# Patient Record
Sex: Female | Born: 2000 | Race: Black or African American | Hispanic: No | Marital: Single | Smoking: Never smoker
Health system: Southern US, Community
[De-identification: ages and names within clinical notes are randomized; demographics above are authoritative.]

---

## 2019-05-16 ENCOUNTER — Emergency Department (HOSPITAL_COMMUNITY): Payer: Federal, State, Local not specified - PPO

## 2019-05-16 ENCOUNTER — Encounter (HOSPITAL_COMMUNITY): Payer: Self-pay | Admitting: Emergency Medicine

## 2019-05-16 ENCOUNTER — Other Ambulatory Visit: Payer: Self-pay

## 2019-05-16 ENCOUNTER — Emergency Department (HOSPITAL_COMMUNITY)
Admission: EM | Admit: 2019-05-16 | Discharge: 2019-05-16 | Disposition: A | Discharge status: Home / Self Care | Payer: Federal, State, Local not specified - PPO | Attending: Emergency Medicine | Admitting: Emergency Medicine

## 2019-05-16 DIAGNOSIS — Y999 Unspecified external cause status: Secondary | ICD-10-CM | POA: Diagnosis not present

## 2019-05-16 DIAGNOSIS — Y9389 Activity, other specified: Secondary | ICD-10-CM | POA: Diagnosis not present

## 2019-05-16 DIAGNOSIS — R0789 Other chest pain: Secondary | ICD-10-CM | POA: Diagnosis not present

## 2019-05-16 DIAGNOSIS — Y92410 Unspecified street and highway as the place of occurrence of the external cause: Secondary | ICD-10-CM | POA: Diagnosis not present

## 2019-05-16 MED ORDER — IBUPROFEN 800 MG PO TABS: 800.0000 mg | ORAL_TABLET | Freq: Three times a day (TID) | ORAL | 0 refills | Status: DC | PRN
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
## 2019-05-16 NOTE — Discharge Instructions (Addendum)
Your x-rays did not show any abnormality.  You can expect to be sore over the next 7 to 10 days.  Use ice and heat on the areas that are most sore.  Return here for any worsening in your condition.
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
## 2019-08-22 ENCOUNTER — Ambulatory Visit: Payer: Federal, State, Local not specified - PPO | Admitting: Adult Health

## 2019-08-23 ENCOUNTER — Ambulatory Visit (INDEPENDENT_AMBULATORY_CARE_PROVIDER_SITE_OTHER): Payer: Federal, State, Local not specified - PPO | Admitting: Adult Health

## 2019-08-23 ENCOUNTER — Other Ambulatory Visit: Payer: Self-pay

## 2019-08-23 ENCOUNTER — Encounter: Payer: Self-pay | Admitting: Adult Health

## 2019-08-23 ENCOUNTER — Ambulatory Visit: Payer: Self-pay | Admitting: Adult Health

## 2019-08-23 VITALS — BP 128/74 | HR 89 | Ht 65.0 in | Wt 138.0 lb

## 2019-08-23 DIAGNOSIS — F411 Generalized anxiety disorder: Secondary | ICD-10-CM

## 2019-08-23 DIAGNOSIS — F331 Major depressive disorder, recurrent, moderate: Secondary | ICD-10-CM | POA: Diagnosis not present

## 2019-08-23 DIAGNOSIS — F410 Panic disorder [episodic paroxysmal anxiety] without agoraphobia: Secondary | ICD-10-CM

## 2019-08-23 DIAGNOSIS — G4700 Insomnia, unspecified: Secondary | ICD-10-CM | POA: Diagnosis not present

## 2019-08-23 MED ORDER — FLUOXETINE HCL 10 MG PO CAPS: ORAL_CAPSULE | ORAL | 2 refills | Status: DC
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
## 2019-09-15 ENCOUNTER — Other Ambulatory Visit: Payer: Self-pay | Admitting: Adult Health

## 2019-09-15 DIAGNOSIS — F411 Generalized anxiety disorder: Secondary | ICD-10-CM

## 2019-09-15 DIAGNOSIS — F410 Panic disorder [episodic paroxysmal anxiety] without agoraphobia: Secondary | ICD-10-CM

## 2019-09-15 DIAGNOSIS — F331 Major depressive disorder, recurrent, moderate: Secondary | ICD-10-CM

## 2019-09-17 NOTE — Telephone Encounter (Signed)
Medication started 08/23/2019 but has f/up on 03/25

## 2019-09-20 ENCOUNTER — Other Ambulatory Visit: Payer: Self-pay

## 2019-09-20 ENCOUNTER — Ambulatory Visit (INDEPENDENT_AMBULATORY_CARE_PROVIDER_SITE_OTHER): Payer: Federal, State, Local not specified - PPO | Admitting: Adult Health

## 2019-09-20 ENCOUNTER — Encounter: Payer: Self-pay | Admitting: Adult Health

## 2019-09-20 DIAGNOSIS — F331 Major depressive disorder, recurrent, moderate: Secondary | ICD-10-CM | POA: Diagnosis not present

## 2019-09-20 DIAGNOSIS — F410 Panic disorder [episodic paroxysmal anxiety] without agoraphobia: Secondary | ICD-10-CM | POA: Diagnosis not present

## 2019-09-20 DIAGNOSIS — F411 Generalized anxiety disorder: Secondary | ICD-10-CM

## 2019-09-20 DIAGNOSIS — G4700 Insomnia, unspecified: Secondary | ICD-10-CM | POA: Diagnosis not present

## 2019-09-20 MED ORDER — FLUOXETINE HCL 20 MG PO CAPS: ORAL_CAPSULE | ORAL | 2 refills | Status: DC

## 2019-09-20 MED ORDER — LORAZEPAM 0.5 MG PO TABS: 0.5000 mg | ORAL_TABLET | Freq: Every day | ORAL | 2 refills | Status: DC

## 2019-09-20 NOTE — Progress Notes (Signed)
Kim Walker [id number] 2001-05-04 18 y.o.  Subjective:   Patient ID:  Kim Walker is a 19 y.o. (DOB 18-Nov-2000) female.  Chief Complaint: No chief complaint on file.   HPI Kim Walker presents to the office today for follow-up of GAD, MDD, insomnia, and panic attacks.    Describes mood today as "improved". Mood symptoms - reports decreased depression, anxiety, and irritability. Stating "my emotions and feelings are better". Has not been "crying" as much.  as opened up to her family about how she feels. Stating my dad was "against" medication initially, but sees that it is helping me. Feels like she is "stressed out", but feels like work is responsible. Working "crazy hours". Getting 40 hours a week (2 jobs) and has 6 classes. Stable interest and motivation. Taking medications as prescribed.  Energy levels "better for the most part". Active, does not have a regular exercise routine.  Enjoys some usual interests and activities. Single. Lives with 2 roommates off campus. Not dating - recent break-up. Family lives in [place]. Appetite adequate. Weight stable. Sleeping difficulties. Averaging 4 to 5 hours with working 2 jobs and studying. Not able to cut mind off when she can sleep. Focus and concentration stable. Completing tasks. Managing aspects of household. 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 Denies SI or HI. Denies AH or VH.  Previous medication trials: Xanax  Review of Systems:  Review of Systems  Musculoskeletal: Negative for gait problem.  Neurological: Negative for tremors.  Psychiatric/Behavioral:       Please refer to HPI    Medications: I have reviewed the patient's current medications.  Current Outpatient Medications  Medication Sig Dispense Refill  . FLUoxetine (PROZAC) 20 MG capsule Take one capsule daily. 30 capsule 2  . ibuprofen (ADVIL) 800 MG tablet Take 1 tablet (800 mg total) by mouth every 8 (eight) hours as needed. 21  tablet 0  . LORazepam (ATIVAN) 0.5 MG tablet Take 1 tablet (0.5 mg total) by mouth at bedtime. 30 tablet 2  . Norethindrone Acetate-Ethinyl Estrad-FE (BLISOVI 24 FE) 1-20 MG-MCG(24) tablet Take by mouth.     No current facility-administered medications for this visit.    Medication Side Effects: None  Allergies: No Known Allergies  No past medical history on file.  No family history on file.  Social History   Socioeconomic History  . Marital status: Single    Spouse name: Not on file  . Number of children: Not on file  . Years of education: Not on file  . Highest education level: Not on file  Occupational History  . Not on file  Tobacco Use  . Smoking status: Never Smoker  . Smokeless tobacco: Never Used  Substance and Sexual Activity  . Alcohol use: Never  . Drug use: Never  . Sexual activity: Not on file  Other Topics Concern  . Not on file  Social History Narrative  . Not on file   Social Determinants of Health   Financial Resource Strain:   . Difficulty of Paying Living Expenses:   Food Insecurity:   . Worried About Programme researcher, broadcasting/film/video in the Last Year:   . Barista in the Last Year:   Transportation Needs:   . Freight forwarder (Medical):   Marland Kitchen Lack of Transportation (Non-Medical):   Physical Activity:   . Days of Exercise per Week:   . Minutes of Exercise per Session:   Stress:   . Feeling  of Stress :   Social Connections:   . Frequency of Communication with Friends and Family:   . Frequency of Social Gatherings with Friends and Family:   . Attends Religious Services:   . Active Member of Clubs or Organizations:   . Attends Archivist Meetings:   Marland Kitchen Marital Status:   Intimate Partner Violence:   . Fear of Current or Ex-Partner:   . Emotionally Abused:   Marland Kitchen Physically Abused:   . Sexually Abused:     Past Medical History, Surgical history, Social history, and Family history were reviewed and updated as appropriate.   Please  see review of systems for further details on the patient's review from today.   Objective:   Physical Exam:  There were no vitals taken for this visit.  Physical Exam Constitutional:      General: She is not in acute distress. Musculoskeletal:        General: No deformity.  Neurological:     Mental Status: She is alert and oriented to person, place, and time.     Coordination: Coordination normal.  Psychiatric:        Attention and Perception: Attention and perception normal. She does not perceive auditory or visual hallucinations.        Mood and Affect: Mood normal. Mood is not anxious or depressed. Affect is not labile, blunt, angry or inappropriate.        Speech: Speech normal.        Behavior: Behavior normal.        Thought Content: Thought content normal. Thought content is not paranoid or delusional. Thought content does not include homicidal or suicidal ideation. Thought content does not include homicidal or suicidal plan.        Cognition and Memory: Cognition and memory normal.        Judgment: Judgment normal.     Comments: Insight intact     Lab Review:  No results found for: NA, K, CL, CO2, GLUCOSE, BUN, CREATININE, CALCIUM, PROT, ALBUMIN, AST, ALT, ALKPHOS, BILITOT, GFRNONAA, GFRAA  No results found for: WBC, RBC, HGB, HCT, PLT, MCV, MCH, MCHC, RDW, LYMPHSABS, MONOABS, EOSABS, BASOSABS  No results found for: POCLITH, LITHIUM   No results found for: PHENYTOIN, PHENOBARB, VALPROATE, CBMZ   .res Assessment: Plan:   Plan:  PDMP reviewed  1. Prozac 20mg  daily 2. Add Lorazepam 0.5mg  at hs  Set up with therapist - sees Sammuel Cooper in April  RTC 4 weeks  Patient advised to contact office with any questions, adverse effects, or acute worsening in signs and symptoms.  Diagnoses and all orders for this visit:  Insomnia, unspecified type  Major depressive disorder, recurrent episode, moderate (HCC) -     FLUoxetine (PROZAC) 20 MG capsule; Take one capsule  daily.  Generalized anxiety disorder -     FLUoxetine (PROZAC) 20 MG capsule; Take one capsule daily. -     LORazepam (ATIVAN) 0.5 MG tablet; Take 1 tablet (0.5 mg total) by mouth at bedtime.  Panic attacks -     FLUoxetine (PROZAC) 20 MG capsule; Take one capsule daily.     Please see After Visit Summary for patient specific instructions.  Future Appointments  Date Time Provider Keys  10/02/2019  2:00 PM Barnie Del, LCSW [facility] None    No orders of the defined types were placed in this encounter.   -------------------------------

## 2019-09-25 ENCOUNTER — Emergency Department (HOSPITAL_COMMUNITY)
Admission: EM | Admit: 2019-09-25 | Discharge: 2019-09-25 | Disposition: A | Discharge status: Home / Self Care | Payer: Federal, State, Local not specified - PPO | Attending: Emergency Medicine | Admitting: Emergency Medicine

## 2019-09-25 ENCOUNTER — Other Ambulatory Visit: Payer: Self-pay

## 2019-09-25 ENCOUNTER — Emergency Department (HOSPITAL_COMMUNITY): Payer: Federal, State, Local not specified - PPO

## 2019-09-25 DIAGNOSIS — Z79899 Other long term (current) drug therapy: Secondary | ICD-10-CM | POA: Diagnosis not present

## 2019-09-25 DIAGNOSIS — R0602 Shortness of breath: Secondary | ICD-10-CM | POA: Diagnosis not present

## 2019-09-25 DIAGNOSIS — R0789 Other chest pain: Secondary | ICD-10-CM | POA: Diagnosis not present

## 2019-09-25 DIAGNOSIS — M940 Chondrocostal junction syndrome [Tietze]: Secondary | ICD-10-CM | POA: Insufficient documentation

## 2019-09-25 MED ORDER — OMEPRAZOLE 20 MG PO CPDR: DELAYED_RELEASE_CAPSULE | ORAL | 0 refills | Status: DC

## 2019-09-25 MED ORDER — IBUPROFEN 200 MG PO TABS
400.0000 mg | ORAL_TABLET | Freq: Once | ORAL | Status: AC
  Administered 2019-09-25: 400 mg via ORAL
  Filled 2019-09-25: qty 2

## 2019-09-25 NOTE — ED Provider Notes (Signed)
[facility] DEPT Provider Note   CSN: [id number] Arrival date & time: 09/25/19  0129   Time seen 5:11 AM  History Chief Complaint  Patient presents with  . Shortness of Breath  . Chest Pain    Kim Walker is a 19 y.o. female.  HPI   Patient states on March 28 she woke up with some pain in the upper central portion of her chest.  She states the pain is sharp and it is there constantly.  She states swallowing and eating sometimes makes it worse, sometimes deep breathing makes it worse.  It does not get worse with deep breaths or movement of her arm.  Nothing makes it feel better.  She states she went to work about 8 PM and then she started feeling like she was short of breath.  She denies cough, fever, sore throat, rhinorrhea, nausea, vomiting, wheezing.  She states she is never had this before.  She denies any family history of coronary artery disease.  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  She has been doing that job for about 2 months.  Patient does not smoke.  No past medical history on file.  There are no problems to display for this patient.   No past surgical history on file.   OB History   No obstetric history on file.     No family history on file.  Social History   Tobacco Use  . Smoking status: Never Smoker  . Smokeless tobacco: Never Used  Substance Use Topics  . Alcohol use: Never  . Drug use: Never  Employed  Home Medications Prior to Admission medications   Medication Sig Start Date End Date Taking? Authorizing Provider  FLUoxetine (PROZAC) 20 MG capsule Take one capsule daily. Patient taking differently: Take 20 mg by mouth daily.  09/20/19  Yes Mozingo, Berdie Ogren, NP  ibuprofen (ADVIL) 800 MG tablet Take 1 tablet (800 mg total) by mouth every 8 (eight) hours as needed. Patient taking differently: Take 800 mg by mouth every 8 (eight) hours as needed  for moderate pain.  05/16/19  Yes Lawyer, Harrell Gave, PA-C  LORazepam (ATIVAN) 0.5 MG tablet Take 1 tablet (0.5 mg total) by mouth at bedtime. 09/20/19  Yes Mozingo, Berdie Ogren, NP  Norethindrone Acetate-Ethinyl Estrad-FE (BLISOVI 24 FE) 1-20 MG-MCG(24) tablet Take 1 tablet by mouth daily.  03/31/18  Yes [provider]  omeprazole (PRILOSEC) 20 MG capsule Take 1 po BID x 2 weeks then once a day 09/25/19   Rolland Porter, MD    Allergies    Patient has no known allergies.  Review of Systems   Review of Systems  All other systems reviewed and are negative.   Physical Exam Updated Vital Signs BP 135/82 (BP Location: Left Arm)   Pulse 72   Temp 98.5 F (36.9 C) (Oral)   Resp 17   Ht 5\' 5"  (1.651 m)   Wt 60.8 kg   LMP 09/20/2019   SpO2 100%   BMI 22.30 kg/m   Physical Exam Vitals and nursing note reviewed.  Constitutional:      Appearance: Normal appearance. She is normal weight.  HENT:     Head: Normocephalic and atraumatic.     Right Ear: External ear normal.     Left Ear: External ear normal.  Eyes:     Extraocular Movements: Extraocular movements intact.     Conjunctiva/sclera:  Conjunctivae normal.     Pupils: Pupils are equal, round, and reactive to light.  Cardiovascular:     Rate and Rhythm: Normal rate and regular rhythm.     Pulses: Normal pulses.  Pulmonary:     Effort: Pulmonary effort is normal. No respiratory distress.     Breath sounds: Normal breath sounds.  Chest:     Chest wall: Tenderness present.       Comments: Area of pain noted, she has some mild tenderness to palpation in that area. Abdominal:     General: Abdomen is flat. Bowel sounds are normal. There is no distension.     Palpations: Abdomen is soft.  Musculoskeletal:        General: Normal range of motion.     Cervical back: Normal range of motion.  Skin:    General: Skin is warm and dry.  Neurological:     General: No focal deficit present.     Mental Status: She is alert  and oriented to person, place, and time.     Cranial Nerves: No cranial nerve deficit.  Psychiatric:        Mood and Affect: Mood normal.        Behavior: Behavior normal.        Thought Content: Thought content normal.        Judgment: Judgment normal.     ED Results / Procedures / Treatments   Labs (all labs ordered are listed, but only abnormal results are displayed)   No results found for this or any previous visit.     EKG EKG Interpretation  Date/Time:  Tuesday September 25 2019 01:45:18 EDT Ventricular Rate:  83 PR Interval:    QRS Duration: 76 QT Interval:  354 QTC Calculation: 416 R Axis:   53 Text Interpretation: Sinus rhythm Borderline T wave abnormalities No old tracing to compare Confirmed by Devoria Albe ([id number]) on 09/25/2019 1:56:21 AM   Radiology DG Chest 2 View  Result Date: 09/25/2019 CLINICAL DATA:  Shortness of breath, chest tightness EXAM: CHEST - 2 VIEW COMPARISON:  05/16/2019 FINDINGS: Lungs are clear.  No pleural effusion or pneumothorax. The heart is normal in size. Visualized osseous structures are within normal limits. IMPRESSION: Normal chest radiographs. Electronically Signed   By: Charline Bills M.D.   On: 09/25/2019 02:13    Procedures Procedures (including critical care time)  Medications Ordered in ED Medications  ibuprofen (ADVIL) tablet 400 mg (400 mg Oral Given 09/25/19 0550)    ED Course  I have reviewed the triage vital signs and the nursing notes.  Pertinent labs & imaging results that were available during my care of the patient were reviewed by me and considered in my medical decision making (see chart for details).    MDM Rules/Calculators/A&P                      Patient was given ibuprofen 400 mg for pain.  She also was started on Prilosec due to her saying sometimes if she swallowed too much it was painful although she did not describe reflux.   Final Clinical Impression(s) / ED Diagnoses Final diagnoses:  Chest wall  pain  Costochondritis    Rx / DC Orders ED Discharge Orders         Ordered    omeprazole (PRILOSEC) 20 MG capsule     09/25/19 0547        otc ibuprofen  Plan discharge  Devoria Albe, MD, Armando Gang  Devoria Albe, MD 09/25/19 [phone]

## 2019-09-25 NOTE — Discharge Instructions (Addendum)
Use ice and heat for comfort. Take ibuprofen 400 mg 4 times a day OR aleve 1 tablet twice a day for pain.  Also because you state it hurts sometimes when you swallow, take the Prilosec or omeprazole as prescribed.  Recheck if you get fever, cough, or struggle to breathe.

## 2019-09-25 NOTE — ED Triage Notes (Signed)
Arrived POV from home. Patient reports shortness of breath and chest pain that started 2 days ago. Patient states when she is walking she becomes so Kim Walker that she has to stop to catch her breath. Patient says this has never happened to her before. No hx of asthma

## 2019-09-25 NOTE — ED Notes (Signed)
Urine and culture sent down to lab 

## 2019-10-02 ENCOUNTER — Ambulatory Visit: Payer: Federal, State, Local not specified - PPO | Admitting: Addiction (Substance Use Disorder)

## 2019-10-12 ENCOUNTER — Other Ambulatory Visit: Payer: Self-pay | Admitting: Adult Health

## 2019-10-12 DIAGNOSIS — F410 Panic disorder [episodic paroxysmal anxiety] without agoraphobia: Secondary | ICD-10-CM

## 2019-10-12 DIAGNOSIS — F411 Generalized anxiety disorder: Secondary | ICD-10-CM

## 2019-10-12 DIAGNOSIS — F331 Major depressive disorder, recurrent, moderate: Secondary | ICD-10-CM

## 2019-10-18 ENCOUNTER — Ambulatory Visit: Payer: Federal, State, Local not specified - PPO | Admitting: Adult Health

## 2019-11-15 ENCOUNTER — Other Ambulatory Visit: Payer: Self-pay | Admitting: Physician Assistant

## 2019-11-15 ENCOUNTER — Other Ambulatory Visit (HOSPITAL_COMMUNITY)
Admission: RE | Admit: 2019-11-15 | Discharge: 2019-11-15 | Disposition: A | Discharge status: Home / Self Care | Payer: Federal, State, Local not specified - PPO | Source: Ambulatory Visit | Attending: Physician Assistant | Admitting: Physician Assistant

## 2019-11-15 DIAGNOSIS — N898 Other specified noninflammatory disorders of vagina: Secondary | ICD-10-CM | POA: Insufficient documentation

## 2019-11-19 LAB — MOLECULAR ANCILLARY ONLY
Bacterial Vaginitis (gardnerella): NEGATIVE
Candida Glabrata: NEGATIVE
Candida Vaginitis: POSITIVE — AB
Chlamydia: NEGATIVE
Comment: NEGATIVE
Comment: NEGATIVE
Comment: NEGATIVE
Comment: NEGATIVE
Comment: NEGATIVE
Comment: NORMAL
Neisseria Gonorrhea: NEGATIVE
Trichomonas: NEGATIVE

## 2019-11-24 ENCOUNTER — Other Ambulatory Visit: Payer: Self-pay

## 2019-11-24 ENCOUNTER — Emergency Department (HOSPITAL_COMMUNITY)
Admission: EM | Admit: 2019-11-24 | Discharge: 2019-11-25 | Disposition: A | Discharge status: Home / Self Care | Payer: Federal, State, Local not specified - PPO | Attending: Emergency Medicine | Admitting: Emergency Medicine

## 2019-11-24 ENCOUNTER — Encounter (HOSPITAL_COMMUNITY): Payer: Self-pay | Admitting: Emergency Medicine

## 2019-11-24 DIAGNOSIS — G8929 Other chronic pain: Secondary | ICD-10-CM

## 2019-11-24 DIAGNOSIS — R110 Nausea: Secondary | ICD-10-CM | POA: Insufficient documentation

## 2019-11-24 DIAGNOSIS — R519 Headache, unspecified: Secondary | ICD-10-CM | POA: Insufficient documentation

## 2019-11-24 LAB — COMPREHENSIVE METABOLIC PANEL
ALT: 13 U/L (ref 0–44)
AST: 17 U/L (ref 15–41)
Albumin: 4.4 g/dL (ref 3.5–5.0)
Alkaline Phosphatase: 51 U/L (ref 38–126)
Anion gap: 10 (ref 5–15)
BUN: 15 mg/dL (ref 6–20)
CO2: 25 mmol/L (ref 22–32)
Calcium: 9.1 mg/dL (ref 8.9–10.3)
Chloride: 102 mmol/L (ref 98–111)
Creatinine, Ser: 0.57 mg/dL (ref 0.44–1.00)
GFR calc Af Amer: >60 mL/min (ref >60)
GFR calc non Af Amer: >60 mL/min (ref >60)
Glucose, Bld: 119 mg/dL — ABNORMAL HIGH (ref 70–99)
Potassium: 3.8 mmol/L (ref 3.5–5.1)
Sodium: 137 mmol/L (ref 135–145)
Total Bilirubin: 0.6 mg/dL (ref 0.3–1.2)
Total Protein: 8.2 g/dL — ABNORMAL HIGH (ref 6.5–8.1)

## 2019-11-24 LAB — CBC
HCT: 40.8 % (ref 36.0–46.0)
Hemoglobin: 12.4 g/dL (ref 12.0–15.0)
MCH: 24.6 pg — ABNORMAL LOW (ref 26.0–34.0)
MCHC: 30.4 g/dL (ref 30.0–36.0)
MCV: 80.8 fL (ref 80.0–100.0)
Platelets: 252 10*3/uL (ref 150–400)
RBC: 5.05 MIL/uL (ref 3.87–5.11)
RDW: 14.2 % (ref 11.5–15.5)
WBC: 6.1 10*3/uL (ref 4.0–10.5)
nRBC: 0 % (ref 0.0–0.2)

## 2019-11-24 LAB — I-STAT BETA HCG BLOOD, ED (MC, WL, AP ONLY): I-stat hCG, quantitative: <5 m[IU]/mL (ref <5)

## 2019-11-24 MED ORDER — DEXAMETHASONE SODIUM PHOSPHATE 10 MG/ML IJ SOLN
10.0000 mg | Freq: Once | INTRAMUSCULAR | Status: AC
  Administered 2019-11-24: 10 mg via INTRAMUSCULAR
  Filled 2019-11-24: qty 1

## 2019-11-24 NOTE — ED Provider Notes (Signed)
[facility] DEPT Provider Note   CSN: [id number] Arrival date & time: 11/24/19  2020     History Chief Complaint  Patient presents with  . Headache    Kim Walker is a 19 y.o. female.  Patient to ED for evaluation of headache for the past 3 days. HA located in bilateral frontal/forehead. It was a gradual onset HA, constant, worse with leaning forward. No congestion, sneezing, sore throat or fever. +photophobia. She endorses nausea without vomiting. No fever. She reports a history of similar headaches with current headache being the most intense ever. She states she has a headache "everyday", usually treated with ibuprofen 800 mg, twice a day.   The history is provided by the patient. No language interpreter was used.  Headache Associated symptoms: nausea   Associated symptoms: no abdominal pain, no congestion, no cough, no fever, no myalgias, no neck pain, no numbness, no sore throat, no vomiting and no weakness        History reviewed. No pertinent past medical history.  There are no problems to display for this patient.   History reviewed. No pertinent surgical history.   OB History   No obstetric history on file.     History reviewed. No pertinent family history.  Social History   Tobacco Use  . Smoking status: Never Smoker  . Smokeless tobacco: Never Used  Substance Use Topics  . Alcohol use: Never  . Drug use: Never    Home Medications Prior to Admission medications   Medication Sig Start Date End Date Taking? Authorizing Provider  FLUoxetine (PROZAC) 20 MG capsule TAKE 1 CAPSULE BY MOUTH EVERY DAY 10/12/19   Mozingo, Berdie Ogren, NP  ibuprofen (ADVIL) 800 MG tablet Take 1 tablet (800 mg total) by mouth every 8 (eight) hours as needed. Patient taking differently: Take 800 mg by mouth every 8 (eight) hours as needed for moderate pain.  05/16/19   Lawyer, Harrell Gave, PA-C  LORazepam (ATIVAN) 0.5 MG tablet Take 1 tablet (0.5  mg total) by mouth at bedtime. 09/20/19   Mozingo, Berdie Ogren, NP  Norethindrone Acetate-Ethinyl Estrad-FE (BLISOVI 24 FE) 1-20 MG-MCG(24) tablet Take 1 tablet by mouth daily.  03/31/18   [provider]  omeprazole (PRILOSEC) 20 MG capsule Take 1 po BID x 2 weeks then once a day 09/25/19   Rolland Porter, MD    Allergies    Patient has no known allergies.  Review of Systems   Review of Systems  Constitutional: Negative for chills and fever.  HENT: Negative.  Negative for congestion, sneezing and sore throat.   Respiratory: Negative.  Negative for cough and shortness of breath.   Cardiovascular: Negative.  Negative for chest pain.  Gastrointestinal: Positive for nausea. Negative for abdominal pain and vomiting.  Musculoskeletal: Negative.  Negative for myalgias and neck pain.  Skin: Negative.   Neurological: Positive for headaches. Negative for syncope, weakness and numbness.    Physical Exam Updated Vital Signs BP (!) 136/108   Pulse 77   Temp 98.2 F (36.8 C)   Resp 16   Ht 5\' 5"  (1.651 m)   Wt 61.2 kg   LMP 11/20/2019   SpO2 99%   BMI 22.47 kg/m   Physical Exam Vitals and nursing note reviewed.  Constitutional:      Appearance: She is well-developed.  HENT:     Head: Normocephalic.     Nose:     Right Sinus: No frontal sinus tenderness.     Left Sinus:  No frontal sinus tenderness.  Eyes:     Pupils: Pupils are equal, round, and reactive to light.  Cardiovascular:     Rate and Rhythm: Normal rate and regular rhythm.  Pulmonary:     Effort: Pulmonary effort is normal.     Breath sounds: Normal breath sounds. No wheezing, rhonchi or rales.  Abdominal:     General: Bowel sounds are normal.     Palpations: Abdomen is soft.     Tenderness: There is no abdominal tenderness. There is no guarding or rebound.  Musculoskeletal:        General: Normal range of motion.     Cervical back: Normal range of motion and neck supple.  Skin:    General: Skin is warm and  dry.  Neurological:     Mental Status: She is alert and oriented to person, place, and time.     GCS: GCS eye subscore is 4. GCS verbal subscore is 5. GCS motor subscore is 6.     Cranial Nerves: No dysarthria or facial asymmetry.     Sensory: No sensory deficit.     Motor: No weakness.     Coordination: Coordination normal.  Psychiatric:        Mood and Affect: Mood normal.     ED Results / Procedures / Treatments   Labs (all labs ordered are listed, but only abnormal results are displayed) Labs Reviewed  CBC - Abnormal; Notable for the following components:      Result Value   MCH 24.6 (*)    All other components within normal limits  COMPREHENSIVE METABOLIC PANEL  I-STAT BETA HCG BLOOD, ED ([facility], [facility], [facility] ONLY)    EKG None  Radiology No results found.  Procedures Procedures (including critical care time)  Medications Ordered in ED Medications - No data to display  ED Course  I have reviewed the triage vital signs and the nursing notes.  Pertinent labs & imaging results that were available during my care of the patient were reviewed by me and considered in my medical decision making (see chart for details).    MDM Rules/Calculators/A&P                      Patient to ED with headache that follows her chronic/recurrent HA pattern, reporting it is more intense than usual.   She is very well appearing. No neurologic deficits on exam. She is driving herself home which limits options with regard to headache cocktail. IM decadron provided. Will reassess for improvement.   On recheck, she reports headache is no different. She is in no apparent distress. Sitting up, talking on the phone easily. NAD. Feel she can be discharged home to continue treatment at home. Will Rx prednisone x 3 additional days. REcommend PCP follow up.   Final Clinical Impression(s) / ED Diagnoses Final diagnoses:  None   1. Nonspecific headache  Rx / DC Orders ED Discharge Orders    None        Danne Harbor 11/25/19 0004    Lorre Nick, MD 11/25/19 [phone]

## 2019-11-24 NOTE — ED Triage Notes (Signed)
Patient is complaining of a headache that started Wednesday and has not went away. At night she states it makes her nauseas. Patient states she has been taking ibuprofen and it is not working.

## 2019-11-25 MED ORDER — PREDNISONE 20 MG PO TABS: 60.0000 mg | ORAL_TABLET | Freq: Every day | ORAL | 0 refills | Status: DC

## 2019-11-25 NOTE — Discharge Instructions (Signed)
Your exam does not show any neurologic deficits that might be caused by a serious condition. Take the medication prescribed and follow up with your doctor when the office opens after the holiday weekend for recheck if headache persists.   If you want to see a specialist about a chronic headache condition, call [facility] for an appointment as discussed.

## 2019-12-07 ENCOUNTER — Encounter: Payer: Self-pay | Admitting: Neurology

## 2020-02-27 NOTE — Progress Notes (Deleted)
NEUROLOGY CONSULTATION NOTE  Kim Walker MRN: [id number] DOB: 05-25-2001  Referring provider: Eliane Decree, PA Primary care provider: Eliane Decree, PA  Reason for consult:  Chronic headaches  HISTORY OF PRESENT ILLNESS: Kim Walker is a 19 year old ***-handed female who presents for chronic headaches.  History supplemented by ED and referring provider's notes.  ***.  Due to a 3 day intractable headache, she went to the ED in late May where she was treated with a headache cocktail.    CBC and CMP from May was unremarkable other than mildly elevated glucose of 119.  Current NSAIDS:  *** Current analgesics:  *** Current triptans:  *** Current ergotamine:  *** Current anti-emetic:  *** Current muscle relaxants:  *** Current anti-anxiolytic: none Current sleep aide:  *** Current Antihypertensive medications:  *** Current Antidepressant medications:  *** Current Anticonvulsant medications:  *** Current anti-CGRP:  *** Current Vitamins/Herbal/Supplements:  *** Current Antihistamines/Decongestants:  *** Other therapy:  *** Hormone/birth control:  *** Other medications:  ***  Past NSAIDS:  *** Past analgesics:  *** Past abortive triptans:  *** Past abortive ergotamine:  *** Past muscle relaxants:  *** Past anti-emetic:  *** Past antihypertensive medications:  *** Past antidepressant medications:  *** Past anticonvulsant medications:  *** Past anti-CGRP:  *** Past vitamins/Herbal/Supplements:  *** Past antihistamines/decongestants:  *** Other past therapies:  ***  Caffeine:  *** Alcohol:  *** Smoker:  *** Diet:  *** Exercise:  *** Depression:  yes; Anxiety:  yes Other pain:  *** Sleep hygiene:  *** Family history of headache:  ***   PAST MEDICAL HISTORY: No past medical history on file.  PAST SURGICAL HISTORY: No past surgical history on file.  MEDICATIONS: Current Outpatient Medications on File Prior to Visit  Medication Sig Dispense Refill  .  FLUoxetine (PROZAC) 20 MG capsule TAKE 1 CAPSULE BY MOUTH EVERY DAY 90 capsule 0  . ibuprofen (ADVIL) 800 MG tablet Take 1 tablet (800 mg total) by mouth every 8 (eight) hours as needed. (Patient taking differently: Take 800 mg by mouth every 8 (eight) hours as needed for moderate pain. ) 21 tablet 0  . LORazepam (ATIVAN) 0.5 MG tablet Take 1 tablet (0.5 mg total) by mouth at bedtime. 30 tablet 2  . Norethindrone Acetate-Ethinyl Estrad-FE (BLISOVI 24 FE) 1-20 MG-MCG(24) tablet Take 1 tablet by mouth daily.     Marland Kitchen omeprazole (PRILOSEC) 20 MG capsule Take 1 po BID x 2 weeks then once a day 60 capsule 0  . predniSONE (DELTASONE) 20 MG tablet Take 3 tablets (60 mg total) by mouth daily. 9 tablet 0   No current facility-administered medications on file prior to visit.    ALLERGIES: No Known Allergies  FAMILY HISTORY: No family history on file. ***.  SOCIAL HISTORY: Social History   Socioeconomic History  . Marital status: Single    Spouse name: Not on file  . Number of children: Not on file  . Years of education: Not on file  . Highest education level: Not on file  Occupational History  . Not on file  Tobacco Use  . Smoking status: Never Smoker  . Smokeless tobacco: Never Used  Vaping Use  . Vaping Use: Every day  Substance and Sexual Activity  . Alcohol use: Never  . Drug use: Never  . Sexual activity: Not on file  Other Topics Concern  . Not on file  Social History Narrative  . Not on file   Social Determinants of Health   Financial  Resource Strain:   . Difficulty of Paying Living Expenses: Not on file  Food Insecurity:   . Worried About Programme researcher, broadcasting/film/video in the Last Year: Not on file  . Ran Out of Food in the Last Year: Not on file  Transportation Needs:   . Lack of Transportation (Medical): Not on file  . Lack of Transportation (Non-Medical): Not on file  Physical Activity:   . Days of Exercise per Week: Not on file  . Minutes of Exercise per Session: Not on file   Stress:   . Feeling of Stress : Not on file  Social Connections:   . Frequency of Communication with Friends and Family: Not on file  . Frequency of Social Gatherings with Friends and Family: Not on file  . Attends Religious Services: Not on file  . Active Member of Clubs or Organizations: Not on file  . Attends Banker Meetings: Not on file  . Marital Status: Not on file  Intimate Partner Violence:   . Fear of Current or Ex-Partner: Not on file  . Emotionally Abused: Not on file  . Physically Abused: Not on file  . Sexually Abused: Not on file    REVIEW OF SYSTEMS: Constitutional: No fevers, chills, or sweats, no generalized fatigue, change in appetite Eyes: No visual changes, double vision, eye pain Ear, nose and throat: No hearing loss, ear pain, nasal congestion, sore throat Cardiovascular: No chest pain, palpitations Respiratory:  No shortness of breath at rest or with exertion, wheezes GastrointestinaI: No nausea, vomiting, diarrhea, abdominal pain, fecal incontinence Genitourinary:  No dysuria, urinary retention or frequency Musculoskeletal:  No neck pain, back pain Integumentary: No rash, pruritus, skin lesions Neurological: as above Psychiatric: No depression, insomnia, anxiety Endocrine: No palpitations, fatigue, diaphoresis, mood swings, change in appetite, change in weight, increased thirst Hematologic/Lymphatic:  No purpura, petechiae. Allergic/Immunologic: no itchy/runny eyes, nasal congestion, recent allergic reactions, rashes  PHYSICAL EXAM: *** General: No acute distress.  Patient appears ***-groomed.  *** Head:  Normocephalic/atraumatic Eyes:  fundi examined but not visualized Neck: supple, no paraspinal tenderness, full range of motion Back: No paraspinal tenderness Heart: regular rate and rhythm Lungs: Clear to auscultation bilaterally. Vascular: No carotid bruits. Neurological Exam: Mental status: alert and oriented to person, place, and  time, recent and remote memory intact, fund of knowledge intact, attention and concentration intact, speech fluent and not dysarthric, language intact. Cranial nerves: CN I: not tested CN II: pupils equal, round and reactive to light, visual fields intact CN III, IV, VI:  full range of motion, no nystagmus, no ptosis CN V: facial sensation intact CN VII: upper and lower face symmetric CN VIII: hearing intact CN IX, X: gag intact, uvula midline CN XI: sternocleidomastoid and trapezius muscles intact CN XII: tongue midline Bulk & Tone: normal, no fasciculations. Motor:  5/5 throughout *** Sensation:  Pinprick *** temperature *** and vibration sensation intact.  ***. Deep Tendon Reflexes:  2+ throughout, *** toes downgoing.  *** Finger to nose testing:  Without dysmetria.  *** Heel to shin:  Without dysmetria.  *** Gait:  Normal station and stride.  Able to turn and tandem walk. Romberg ***.  IMPRESSION: ***  PLAN: ***  Thank you for allowing me to take part in the care of this patient.  Shon Millet, DO  CC: ***

## 2020-02-28 ENCOUNTER — Ambulatory Visit: Payer: Federal, State, Local not specified - PPO | Admitting: Neurology

## 2020-03-10 ENCOUNTER — Other Ambulatory Visit: Payer: Self-pay | Admitting: Adult Health

## 2020-03-10 DIAGNOSIS — F410 Panic disorder [episodic paroxysmal anxiety] without agoraphobia: Secondary | ICD-10-CM

## 2020-03-10 DIAGNOSIS — F331 Major depressive disorder, recurrent, moderate: Secondary | ICD-10-CM

## 2020-03-10 DIAGNOSIS — F411 Generalized anxiety disorder: Secondary | ICD-10-CM

## 2020-03-10 NOTE — Telephone Encounter (Signed)
Please review

## 2020-08-02 ENCOUNTER — Other Ambulatory Visit: Payer: Self-pay | Admitting: Adult Health

## 2020-08-02 DIAGNOSIS — F331 Major depressive disorder, recurrent, moderate: Secondary | ICD-10-CM

## 2020-08-02 DIAGNOSIS — F410 Panic disorder [episodic paroxysmal anxiety] without agoraphobia: Secondary | ICD-10-CM

## 2020-08-02 DIAGNOSIS — F411 Generalized anxiety disorder: Secondary | ICD-10-CM

## 2020-08-17 ENCOUNTER — Other Ambulatory Visit: Payer: Self-pay | Admitting: Adult Health

## 2020-08-17 DIAGNOSIS — F331 Major depressive disorder, recurrent, moderate: Secondary | ICD-10-CM

## 2020-08-17 DIAGNOSIS — F411 Generalized anxiety disorder: Secondary | ICD-10-CM

## 2020-08-17 DIAGNOSIS — F410 Panic disorder [episodic paroxysmal anxiety] without agoraphobia: Secondary | ICD-10-CM

## 2020-09-03 ENCOUNTER — Encounter: Payer: Self-pay | Admitting: Addiction (Substance Use Disorder)

## 2020-09-03 ENCOUNTER — Other Ambulatory Visit: Payer: Self-pay

## 2020-09-03 ENCOUNTER — Ambulatory Visit (INDEPENDENT_AMBULATORY_CARE_PROVIDER_SITE_OTHER): Payer: Federal, State, Local not specified - PPO | Admitting: Addiction (Substance Use Disorder)

## 2020-09-03 DIAGNOSIS — F411 Generalized anxiety disorder: Secondary | ICD-10-CM | POA: Diagnosis not present

## 2020-09-03 DIAGNOSIS — F410 Panic disorder [episodic paroxysmal anxiety] without agoraphobia: Secondary | ICD-10-CM
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
## 2020-09-19 ENCOUNTER — Ambulatory Visit: Payer: Federal, State, Local not specified - PPO | Admitting: Addiction (Substance Use Disorder)

## 2020-09-26 ENCOUNTER — Ambulatory Visit: Payer: Federal, State, Local not specified - PPO | Admitting: Adult Health

## 2020-10-07 ENCOUNTER — Encounter: Payer: Self-pay | Admitting: Adult Health

## 2020-10-07 ENCOUNTER — Other Ambulatory Visit: Payer: Self-pay | Admitting: Adult Health

## 2020-10-07 ENCOUNTER — Ambulatory Visit (INDEPENDENT_AMBULATORY_CARE_PROVIDER_SITE_OTHER): Payer: Federal, State, Local not specified - PPO | Admitting: Adult Health

## 2020-10-07 ENCOUNTER — Other Ambulatory Visit: Payer: Self-pay

## 2020-10-07 DIAGNOSIS — F332 Major depressive disorder, recurrent severe without psychotic features: Secondary | ICD-10-CM | POA: Insufficient documentation

## 2020-10-07 DIAGNOSIS — F321 Major depressive disorder, single episode, moderate: Secondary | ICD-10-CM | POA: Insufficient documentation

## 2020-10-07 DIAGNOSIS — F419 Anxiety disorder, unspecified: Secondary | ICD-10-CM | POA: Insufficient documentation

## 2020-10-07 DIAGNOSIS — F411 Generalized anxiety disorder: Secondary | ICD-10-CM

## 2020-10-07 DIAGNOSIS — F331 Major depressive disorder, recurrent, moderate: Secondary | ICD-10-CM

## 2020-10-07 DIAGNOSIS — F410 Panic disorder [episodic paroxysmal anxiety] without agoraphobia: Secondary | ICD-10-CM

## 2020-10-07 DIAGNOSIS — G4700 Insomnia, unspecified: Secondary | ICD-10-CM

## 2020-10-07 DIAGNOSIS — G44229 Chronic tension-type headache, not intractable: Secondary | ICD-10-CM | POA: Insufficient documentation

## 2020-10-07 MED ORDER — FLUOXETINE HCL 20 MG PO CAPS: ORAL_CAPSULE | ORAL | 2 refills | Status: DC

## 2020-10-07 NOTE — Progress Notes (Signed)
Kim Walker [id number] August 16, 2000 20 y.o.  Subjective:   Patient ID:  Kim Walker is a 20 y.o. (DOB [birth year]) female.  Chief Complaint: No chief complaint on file.   HPI Kim Walker presents to the office today for follow-up of GAD, MDD, insomnia, and panic attacks.   Describes mood today as "ok". Pleasant. Tearful at times - "not as much". Mood symptoms - reports depression - some days, not as bad", anxiety - increased lately, and irritability. Stating :I feel like I'm doing alright, but need to restart my medications". Stopped Prozac in 2021 and would like to restart it. Stable interest and motivation. Taking medications as prescribed.  Energy levels "iffy". Active, has a regular exercise routine - gym membership.  Enjoys some usual interests and activities. Single. Lives alone. Family lives in [place]. Appetite adequate. Weight stable. Sleeping difficulties. Averages 7 to 8 hours. Focus and concentration stable. Completing tasks. Managing aspects of household. 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 Plans to apply to [employer]. Denies SI or HI.  Denies AH or VH.  Previous medication trials: Xanax  Review of Systems:  Review of Systems  Musculoskeletal: Negative for gait problem.  Neurological: Negative for tremors.  Psychiatric/Behavioral:       Please refer to HPI    Medications: I have reviewed the patient's current medications.  Current Outpatient Medications  Medication Sig Dispense Refill  . FLUoxetine (PROZAC) 20 MG capsule Take one capsule daily. 30 capsule 2  . ibuprofen (ADVIL) 800 MG tablet Take 1 tablet (800 mg total) by mouth every 8 (eight) hours as needed. (Patient taking differently: Take 800 mg by mouth every 8 (eight) hours as needed for moderate pain. ) 21 tablet 0  . LORazepam (ATIVAN) 0.5 MG tablet TAKE 1 TABLET BY MOUTH AT BEDTIME. 30 tablet 2  . Norethindrone Acetate-Ethinyl Estrad-FE (BLISOVI 24 FE) 1-20 MG-MCG(24)  tablet Take 1 tablet by mouth daily.     Marland Kitchen omeprazole (PRILOSEC) 20 MG capsule Take 1 po BID x 2 weeks then once a day 60 capsule 0  . predniSONE (DELTASONE) 20 MG tablet Take 3 tablets (60 mg total) by mouth daily. 9 tablet 0   No current facility-administered medications for this visit.    Medication Side Effects: None  Allergies: No Known Allergies  No past medical history on file.  No family history on file.  Social History   Socioeconomic History  . Marital status: Single    Spouse name: Not on file  . Number of children: Not on file  . Years of education: Not on file  . Highest education level: Not on file  Occupational History  . Not on file  Tobacco Use  . Smoking status: Never Smoker  . Smokeless tobacco: Never Used  Vaping Use  . Vaping Use: Every day  Substance and Sexual Activity  . Alcohol use: Never  . Drug use: Never  . Sexual activity: Not on file  Other Topics Concern  . Not on file  Social History Narrative  . Not on file   Social Determinants of Health   Financial Resource Strain: Not on file  Food Insecurity: Not on file  Transportation Needs: Not on file  Physical Activity: Not on file  Stress: Not on file  Social Connections: Not on file  Intimate Partner Violence: Not on file    Past Medical History, Surgical history, Social history, and Family history were reviewed and updated as appropriate.  Please see review of systems for further details on the patient's review from today.   Objective:   Physical Exam:  There were no vitals taken for this visit.  Physical Exam Constitutional:      General: She is not in acute distress. Musculoskeletal:        General: No deformity.  Neurological:     Mental Status: She is alert and oriented to person, place, and time.     Coordination: Coordination normal.  Psychiatric:        Attention and Perception: Attention and perception normal. She does not perceive auditory or visual  hallucinations.        Mood and Affect: Mood normal. Mood is not anxious or depressed. Affect is not labile, blunt, angry or inappropriate.        Speech: Speech normal.        Behavior: Behavior normal.        Thought Content: Thought content normal. Thought content is not paranoid or delusional. Thought content does not include homicidal or suicidal ideation. Thought content does not include homicidal or suicidal plan.        Cognition and Memory: Cognition and memory normal.        Judgment: Judgment normal.     Comments: Insight intact     Lab Review:     Component Value Date/Time   NA 137 11/24/2019 2115   K 3.8 11/24/2019 2115   CL 102 11/24/2019 2115   CO2 25 11/24/2019 2115   GLUCOSE 119 (H) 11/24/2019 2115   BUN 15 11/24/2019 2115   CREATININE 0.57 11/24/2019 2115   CALCIUM 9.1 11/24/2019 2115   PROT 8.2 (H) 11/24/2019 2115   ALBUMIN 4.4 11/24/2019 2115   AST 17 11/24/2019 2115   ALT 13 11/24/2019 2115   ALKPHOS 51 11/24/2019 2115   BILITOT 0.6 11/24/2019 2115   GFRNONAA >60 11/24/2019 2115   GFRAA >60 11/24/2019 2115       Component Value Date/Time   WBC 6.1 11/24/2019 2115   RBC 5.05 11/24/2019 2115   HGB 12.4 11/24/2019 2115   HCT 40.8 11/24/2019 2115   PLT 252 11/24/2019 2115   MCV 80.8 11/24/2019 2115   MCH 24.6 (L) 11/24/2019 2115   MCHC 30.4 11/24/2019 2115   RDW 14.2 11/24/2019 2115    No results found for: POCLITH, LITHIUM   No results found for: PHENYTOIN, PHENOBARB, VALPROATE, CBMZ   .res Assessment: Plan:    Plan:  PDMP reviewed  1. Restart Prozac 20mg  daily 2. Restart Lorazepam 0.5mg  at hs  Set up with therapist    RTC 4 weeks  Patient advised to contact office with any questions, adverse effects, or acute worsening in signs and symptoms.   Diagnoses and all orders for this visit:  Generalized anxiety disorder -     FLUoxetine (PROZAC) 20 MG capsule; Take one capsule daily.  Panic attacks -     FLUoxetine (PROZAC) 20 MG  capsule; Take one capsule daily.  Major depressive disorder, recurrent episode, moderate (HCC) -     FLUoxetine (PROZAC) 20 MG capsule; Take one capsule daily.  Insomnia, unspecified type -     FLUoxetine (PROZAC) 20 MG capsule; Take one capsule daily.     Please see After Visit Summary for patient specific instructions.  Future Appointments  Date Time Provider Department Center  10/24/2020  9:00 AM 10/26/2020, LCSW [facility] None  11/04/2020  8:00 AM 01/04/2021, LCSW [facility] None  11/18/2020  8:00 AM Pauline Good, LCSW [facility] None  12/02/2020  8:00 AM Pauline Good, LCSW [facility] None  12/16/2020  8:00 AM Pauline Good, LCSW [facility] None    No orders of the defined types were placed in this encounter.   -------------------------------

## 2020-10-24 ENCOUNTER — Ambulatory Visit: Payer: Federal, State, Local not specified - PPO | Admitting: Addiction (Substance Use Disorder)

## 2020-11-04 ENCOUNTER — Ambulatory Visit: Payer: Federal, State, Local not specified - PPO | Admitting: Addiction (Substance Use Disorder)

## 2020-11-05 ENCOUNTER — Ambulatory Visit: Payer: Federal, State, Local not specified - PPO | Admitting: Adult Health

## 2020-11-18 ENCOUNTER — Ambulatory Visit: Payer: Federal, State, Local not specified - PPO | Admitting: Addiction (Substance Use Disorder)

## 2020-12-02 ENCOUNTER — Ambulatory Visit: Payer: Federal, State, Local not specified - PPO | Admitting: Addiction (Substance Use Disorder)

## 2020-12-16 ENCOUNTER — Ambulatory Visit: Payer: Federal, State, Local not specified - PPO | Admitting: Addiction (Substance Use Disorder)

## 2021-01-19 ENCOUNTER — Telehealth: Payer: Self-pay | Admitting: Adult Health

## 2021-01-19 NOTE — Telephone Encounter (Signed)
Pt has had covid and is very depressed. Pt wants to talk to you about going home to [place]. She needs a note from you. Please call her at [phone]

## 2021-01-19 NOTE — Telephone Encounter (Signed)
Pt stated she is going home to [place] July 27-aug 1st and her job is ok with it but she needs a letter stating to excuse her absence due to mental health reasons.

## 2021-01-20 NOTE — Telephone Encounter (Signed)
Noted  

## 2021-01-20 NOTE — Telephone Encounter (Signed)
The note has been printed to letterhead and is in Gina's box to sign.  When signed call [facility] for pick up.

## 2021-01-20 NOTE — Telephone Encounter (Signed)
Pt will come today to get it

## 2021-01-20 NOTE — Telephone Encounter (Signed)
Note written - will she be coming by to pick up?

## 2021-02-19 ENCOUNTER — Other Ambulatory Visit: Payer: Self-pay

## 2021-02-19 ENCOUNTER — Emergency Department: Admit: 2021-02-19 | Discharge status: Absent without leave | Payer: Self-pay

## 2021-02-19 ENCOUNTER — Emergency Department (INDEPENDENT_AMBULATORY_CARE_PROVIDER_SITE_OTHER)
Admission: EM | Admit: 2021-02-19 | Discharge: 2021-02-19 | Disposition: A | Discharge status: Home / Self Care | Payer: Federal, State, Local not specified - PPO | Source: Home / Self Care | Attending: Family Medicine | Admitting: Family Medicine

## 2021-02-19 DIAGNOSIS — Z113 Encounter for screening for infections with a predominantly sexual mode of transmission: Secondary | ICD-10-CM

## 2021-02-19 NOTE — ED Triage Notes (Signed)
Pt presents with concern for STI testing. Pt st she wants the blood work bc she recently had negative sti testing. 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

## 2021-02-19 NOTE — ED Provider Notes (Signed)
[facility]    CSN: [id number] Arrival date & time: 02/19/21  1348      History   Chief Complaint Chief Complaint  Patient presents with   SEXUALLY TRANSMITTED DISEASE    HPI Kim Walker is a 20 y.o. female.   HPI  Patient went to a different clinic desiring testing for sexually transmitted diseases.  They did a vaginal swab that we check for GC chlamydia and trichomonas.  This was negative.  This facility was unable to do blood testing.  She is here today for blood work to check for STD infection. 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  She wants to know whether blood work should be done for this.  This is discussed with her as not recommended.  History reviewed. No pertinent past medical history.  Patient Active Problem List   Diagnosis Date Noted   Anxiety 10/07/2020   Moderate major depression, single episode (HCC) 10/07/2020   Panic attack 10/07/2020   Severe recurrent major depression without psychotic features (HCC) 10/07/2020   Chronic tension-type headache 10/07/2020    History reviewed. No pertinent surgical history.  OB History   No obstetric history on file.      Home Medications    Prior to Admission medications   Medication Sig Start Date End Date Taking? Authorizing Provider  FLUoxetine (PROZAC) 20 MG capsule TAKE 1 CAPSULE BY MOUTH EVERY DAY 10/07/20   Mozingo, Thereasa Solo, NP  ibuprofen (ADVIL) 800 MG tablet Take 1 tablet (800 mg total) by mouth every 8 (eight) hours as needed. Patient taking differently: Take 800 mg by mouth every 8 (eight) hours as needed for moderate pain.  05/16/19   Lawyer, Cristal Deer, PA-C  LORazepam (ATIVAN) 0.5 MG tablet TAKE 1 TABLET BY MOUTH AT BEDTIME. 08/18/20   Mozingo, Thereasa Solo, NP  Norethindrone Acetate-Ethinyl Estrad-FE (BLISOVI 24 FE) 1-20 MG-MCG(24) tablet Take 1 tablet by mouth daily.  03/31/18   [provider]    Family History History reviewed. No pertinent  family history.  Social History Social History   Tobacco Use   Smoking status: Never   Smokeless tobacco: Never  Vaping Use   Vaping Use: Every day   Substances: Nicotine, Flavoring  Substance Use Topics   Alcohol use: Never   Drug use: Never     Allergies   Patient has no known allergies.   Review of Systems Review of Systems See HPI  Physical Exam Triage Vital Signs ED Triage Vitals  Enc Vitals Group     BP 02/19/21 1424 107/69     Pulse Rate 02/19/21 1422 91     Resp 02/19/21 1422 16     Temp 02/19/21 1422 99 F (37.2 C)     Temp Source 02/19/21 1422 Oral     SpO2 02/19/21 1422 97 %     Weight 02/19/21 1419 140 lb (63.5 kg)     Height 02/19/21 1419 5\' 5"  (1.651 m)     Head Circumference --      Peak Flow --      Pain Score 02/19/21 1418 4     Pain Loc --      Pain Edu? --      Excl. in GC? --    No data found.  Updated Vital Signs BP 107/69 (BP Location: Left Arm)   Pulse 91   Temp 99 F (37.2 C) (Oral)   Resp 16   Ht 5\' 5"  (1.651 m)  Wt 63.5 kg   LMP 02/17/2021 (Exact Date)   SpO2 97%   BMI 23.30 kg/m     Physical Exam Constitutional:      General: She is not in acute distress.    Appearance: She is well-developed.  HENT:     Head: Normocephalic and atraumatic.  Eyes:     Conjunctiva/sclera: Conjunctivae normal.     Pupils: Pupils are equal, round, and reactive to light.  Cardiovascular:     Rate and Rhythm: Normal rate.  Pulmonary:     Effort: Pulmonary effort is normal. No respiratory distress.  Abdominal:     General: There is no distension.     Palpations: Abdomen is soft.  Musculoskeletal:        General: Normal range of motion.     Cervical back: Normal range of motion.  Skin:    General: Skin is warm and dry.  Neurological:     Mental Status: She is alert.     UC Treatments / Results  Labs (all labs ordered are listed, but only abnormal results are displayed) Labs Reviewed  RPR  HIV ANTIBODY (ROUTINE TESTING W  REFLEX)    EKG   Radiology No results found.  Procedures Procedures (including critical care time)  Medications Ordered in UC Medications - No data to display  Initial Impression / Assessment and Plan / UC Course  I have reviewed the triage vital signs and the nursing notes.  Pertinent labs & imaging results that were available during my care of the patient were reviewed by me and considered in my medical decision making (see chart for details).      Final Clinical Impressions(s) / UC Diagnoses   Final diagnoses:  Screening for STD (sexually transmitted disease)     Discharge Instructions      Check for test results in my Chart   ED Prescriptions   None    PDMP not reviewed this encounter.   Eustace Moore, MD 02/19/21 [phone]

## 2021-02-19 NOTE — Discharge Instructions (Addendum)
Check for test results in my Chart

## 2021-03-08 ENCOUNTER — Telehealth: Payer: Self-pay | Admitting: Emergency Medicine

## 2021-03-08 NOTE — Telephone Encounter (Signed)
RN received a voice regarding pt's lab results. HIV screen & RPR drawn on 02/19/21, pt has not received any results. Call to [facility] by RN regarding labs in process from 02/19/21. Per Terri at [facility], no specimen was received. No record of test in question.  Call back to pt to apologize for her not receiving results. RN explained that lab did not receive specimen. Denaya confirmed lab draw on 02/19/21. RN asked pt to return to [facility] for a repeat lab draw tomorrow. 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

## 2021-03-09 ENCOUNTER — Telehealth: Payer: Self-pay

## 2021-03-09 DIAGNOSIS — Z7689 Persons encountering health services in other specified circumstances: Secondary | ICD-10-CM

## 2021-03-10 LAB — RPR: RPR Ser Ql: NONREACTIVE

## 2021-03-10 LAB — HIV ANTIBODY (ROUTINE TESTING W REFLEX): HIV 1&2 Ab, 4th Generation: NONREACTIVE
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
## 2021-05-14 ENCOUNTER — Other Ambulatory Visit: Payer: Self-pay

## 2021-05-14 ENCOUNTER — Ambulatory Visit (INDEPENDENT_AMBULATORY_CARE_PROVIDER_SITE_OTHER): Payer: Federal, State, Local not specified - PPO | Admitting: Adult Health

## 2021-05-14 ENCOUNTER — Encounter: Payer: Self-pay | Admitting: Adult Health

## 2021-05-14 DIAGNOSIS — F331 Major depressive disorder, recurrent, moderate: Secondary | ICD-10-CM | POA: Diagnosis not present

## 2021-05-14 DIAGNOSIS — F410 Panic disorder [episodic paroxysmal anxiety] without agoraphobia: Secondary | ICD-10-CM

## 2021-05-14 DIAGNOSIS — G4700 Insomnia, unspecified: Secondary | ICD-10-CM

## 2021-05-14 DIAGNOSIS — F411 Generalized anxiety disorder: Secondary | ICD-10-CM

## 2021-05-14 MED ORDER — LORAZEPAM 0.5 MG PO TABS: 0.5000 mg | ORAL_TABLET | Freq: Every day | ORAL | 2 refills | Status: DC

## 2021-05-14 NOTE — Progress Notes (Signed)
[name] [id number] 07/19/2000 20 y.o.  Subjective:   Patient ID:  Kim Walker is a 20 y.o. (DOB [birth year]) female.  Chief Complaint: No chief complaint on file.   HPI Kim Walker presents to the office today for follow-up of GAD, MDD, insomnia, and panic attacks.   Describes mood today as "not the best". Pleasant. Tearful at times. Mood symptoms - reports depression, anxiety, and irritability. Reports mood swings - "more highs right now". Stating "I feel like I'm struggling". 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 Feeling overwhelmed from multiple stressors. Has been unable to make appointments for medication changes or see therapist because she does not have FMLA in place and employer will not dismiss time off for appointments. Decreased interest and motivation. Taking medications as prescribed.  Energy levels "on and off". Active, does not have a regular exercise routine. Enjoys some usual interests and activities. Single. Lives alone. Family lives in [place]. Appetite adequate. Weight gain - "stressed". Sleeping difficulties. Averages 5 hours. Focus and concentration stable. Completing tasks. Managing aspects of household. 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 Plans to apply to [employer] - taking "LAST". Denies SI or HI.  Denies AH or VH.  Previous medication trials: Xanax    Flowsheet Row ED from 02/19/2021 in [facility] at [facility] RISK CATEGORY No Risk        Review of Systems:  Review of Systems  Musculoskeletal:  Negative for gait problem.  Neurological:  Negative for tremors.  Psychiatric/Behavioral:         Please refer to HPI   Medications: I have reviewed the patient's current medications.  Current Outpatient Medications  Medication Sig Dispense Refill   FLUoxetine (PROZAC) 20 MG capsule TAKE 1 CAPSULE BY MOUTH EVERY DAY  90 capsule 1   ibuprofen (ADVIL) 800 MG tablet Take 1 tablet (800 mg total) by mouth every 8 (eight) hours as needed. (Patient taking differently: Take 800 mg by mouth every 8 (eight) hours as needed for moderate pain. ) 21 tablet 0   LORazepam (ATIVAN) 0.5 MG tablet Take 1 tablet (0.5 mg total) by mouth at bedtime. 30 tablet 2   Norethindrone Acetate-Ethinyl Estrad-FE (BLISOVI 24 FE) 1-20 MG-MCG(24) tablet Take 1 tablet by mouth daily.      No current facility-administered medications for this visit.    Medication Side Effects: None  Allergies: No Known Allergies  No past medical history on file.  Past Medical History, Surgical history, Social history, and Family history were reviewed and updated as appropriate.   Please see review of systems for further details on the patient's review from today.   Objective:   Physical Exam:  There were no vitals taken for this visit.  Physical Exam Constitutional:      General: She is not in acute distress. Musculoskeletal:        General: No deformity.  Neurological:     Mental Status: She is alert and oriented to person, place, and time.     Coordination: Coordination normal.  Psychiatric:        Attention and Perception: Attention and perception normal. She does not perceive auditory or visual hallucinations.        Mood and Affect: Mood normal. Mood is not anxious or depressed. Affect is not labile, blunt, angry or inappropriate.        Speech:  Speech normal.        Behavior: Behavior normal.        Thought Content: Thought content normal. Thought content is not paranoid or delusional. Thought content does not include homicidal or suicidal ideation. Thought content does not include homicidal or suicidal plan.        Cognition and Memory: Cognition and memory normal.        Judgment: Judgment normal.     Comments: Insight intact    Lab Review:     Component Value Date/Time   NA 137 11/24/2019 2115   K 3.8 11/24/2019 2115   CL 102  11/24/2019 2115   CO2 25 11/24/2019 2115   GLUCOSE 119 (H) 11/24/2019 2115   BUN 15 11/24/2019 2115   CREATININE 0.57 11/24/2019 2115   CALCIUM 9.1 11/24/2019 2115   PROT 8.2 (H) 11/24/2019 2115   ALBUMIN 4.4 11/24/2019 2115   AST 17 11/24/2019 2115   ALT 13 11/24/2019 2115   ALKPHOS 51 11/24/2019 2115   BILITOT 0.6 11/24/2019 2115   GFRNONAA >60 11/24/2019 2115   GFRAA >60 11/24/2019 2115       Component Value Date/Time   WBC 6.1 11/24/2019 2115   RBC 5.05 11/24/2019 2115   HGB 12.4 11/24/2019 2115   HCT 40.8 11/24/2019 2115   PLT 252 11/24/2019 2115   MCV 80.8 11/24/2019 2115   MCH 24.6 (L) 11/24/2019 2115   MCHC 30.4 11/24/2019 2115   RDW 14.2 11/24/2019 2115    No results found for: POCLITH, LITHIUM   No results found for: PHENYTOIN, PHENOBARB, VALPROATE, CBMZ   .res Assessment: Plan:     Plan:  PDMP reviewed  1. Prozac 20mg  daily 2. Lorazepam 0.5mg  at hs  Set up with therapist    RTC 4 weeks  Patient advised to contact office with any questions, adverse effects, or acute worsening in signs and symptoms.   Diagnoses and all orders for this visit:  Panic attacks  Generalized anxiety disorder -     LORazepam (ATIVAN) 0.5 MG tablet; Take 1 tablet (0.5 mg total) by mouth at bedtime.  Major depressive disorder, recurrent episode, moderate (HCC)  Insomnia, unspecified type    Please see After Visit Summary for patient specific instructions.  Future Appointments  Date Time Provider Department Center  07/15/2021  2:00 PM 07/17/2021, LCSW [facility] None  07/29/2021  8:00 AM 09/26/2021, LCSW [facility] None  08/12/2021  8:00 AM 08/14/2021, LCSW [facility] None    No orders of the defined types were placed in this encounter.   -------------------------------

## 2021-06-01 ENCOUNTER — Telehealth: Payer: Self-pay | Admitting: Adult Health

## 2021-06-01 NOTE — Telephone Encounter (Signed)
Pt came by to drop off FMLA ppwk which she says needs to be completed and faxed by Dec 6. She signed an ROI and fax # is [phone]. Given to [place].

## 2021-06-02 NOTE — Telephone Encounter (Signed)
Form is the same as was completed on 05/14/21. Pt reports receiving it in the mail so she wasn't sure. Will refax the form we completed.

## 2021-06-18 ENCOUNTER — Telehealth: Payer: Self-pay | Admitting: Adult Health

## 2021-06-18 NOTE — Telephone Encounter (Signed)
Kim Walker called stating that the Lorazepam she was put on is now causing her heart to race, her head is pounding and is very irritated. Could the Lorazepam be causing these side effects? Her phone number is [phone]

## 2021-06-18 NOTE — Telephone Encounter (Signed)
Unable to reach pt

## 2021-07-15 ENCOUNTER — Ambulatory Visit: Payer: Federal, State, Local not specified - PPO | Admitting: Addiction (Substance Use Disorder)

## 2021-07-29 ENCOUNTER — Ambulatory Visit: Payer: Federal, State, Local not specified - PPO | Admitting: Addiction (Substance Use Disorder)

## 2021-08-01 ENCOUNTER — Emergency Department
Admission: RE | Admit: 2021-08-01 | Discharge: 2021-08-01 | Disposition: A | Discharge status: Home / Self Care | Payer: Federal, State, Local not specified - PPO | Source: Ambulatory Visit | Attending: Family Medicine | Admitting: Family Medicine

## 2021-08-01 ENCOUNTER — Other Ambulatory Visit (HOSPITAL_COMMUNITY)
Admission: RE | Admit: 2021-08-01 | Discharge: 2021-08-01 | Disposition: A | Discharge status: Home / Self Care | Payer: Federal, State, Local not specified - PPO | Source: Ambulatory Visit | Attending: Family Medicine | Admitting: Family Medicine

## 2021-08-01 ENCOUNTER — Other Ambulatory Visit: Payer: Self-pay

## 2021-08-01 VITALS — BP 120/74 | HR 105 | Temp 99.5°F | Resp 20 | Ht 65.0 in | Wt 143.0 lb

## 2021-08-01 DIAGNOSIS — Z7689 Persons encountering health services in other specified circumstances: Secondary | ICD-10-CM

## 2021-08-01 DIAGNOSIS — Z113 Encounter for screening for infections with a predominantly sexual mode of transmission: Secondary | ICD-10-CM | POA: Insufficient documentation

## 2021-08-01 DIAGNOSIS — Z202 Contact with and (suspected) exposure to infections with a predominantly sexual mode of transmission: Secondary | ICD-10-CM | POA: Diagnosis present

## 2021-08-01 NOTE — Discharge Instructions (Addendum)
Check MyChart for your test results Safe sex recommended

## 2021-08-01 NOTE — ED Provider Notes (Signed)
[facility]    CSN: [id number] Arrival date & time: 08/01/21  1259      History   Chief Complaint Chief Complaint  Patient presents with   std check    Std check    HPI Kim Walker is a 21 y.o. female.   HPI  Patient is here requesting STD check She has not been exposed to any known illness She is not having any symptoms  History reviewed. No pertinent past medical history.  Patient Active Problem List   Diagnosis Date Noted   Anxiety 10/07/2020   Moderate major depression, single episode (HCC) 10/07/2020   Panic attack 10/07/2020   Severe recurrent major depression without psychotic features (HCC) 10/07/2020   Chronic tension-type headache 10/07/2020    History reviewed. No pertinent surgical history.  OB History   No obstetric history on file.      Home Medications    Prior to Admission medications   Medication Sig Start Date End Date Taking? Authorizing Provider  etonogestrel (NEXPLANON) 68 MG IMPL implant 1 each by Subdermal route once.   Yes [provider]  LORazepam (ATIVAN) 0.5 MG tablet Take 1 tablet (0.5 mg total) by mouth at bedtime. 05/14/21  Yes Mozingo, Thereasa Solo, NP    Family History History reviewed. No pertinent family history.  Social History Social History   Tobacco Use   Smoking status: Never   Smokeless tobacco: Never  Vaping Use   Vaping Use: Every day   Substances: Nicotine, Flavoring  Substance Use Topics   Alcohol use: Never   Drug use: Never     Allergies   Patient has no known allergies.   Review of Systems Review of Systems See HPI  Physical Exam Triage Vital Signs ED Triage Vitals  Enc Vitals Group     BP 08/01/21 1312 120/74     Pulse Rate 08/01/21 1312 (!) 105     Resp 08/01/21 1312 20     Temp 08/01/21 1312 100 F (37.8 C)     Temp Source 08/01/21 1312 Oral     SpO2 --      Weight 08/01/21 1309 143 lb (64.9 kg)     Height 08/01/21 1309 5\' 5"  (1.651 m)     Head  Circumference --      Peak Flow --      Pain Score 08/01/21 1309 0     Pain Loc --      Pain Edu? --      Excl. in GC? --    No data found.  Updated Vital Signs BP 120/74 (BP Location: Left Arm)    Pulse (!) 105    Temp 100 F (37.8 C) (Oral)    Resp 20    Ht 5\' 5"  (1.651 m)    Wt 64.9 kg    LMP 05/27/2021 Comment: nexplanon   BMI 23.80 kg/m      Physical Exam Constitutional:      General: She is not in acute distress.    Appearance: Normal appearance. She is well-developed.  HENT:     Head: Normocephalic and atraumatic.     Right Ear: Tympanic membrane and ear canal normal.     Left Ear: Tympanic membrane and ear canal normal.     Nose: Nose normal.     Mouth/Throat:     Mouth: Mucous membranes are moist.     Pharynx: No posterior oropharyngeal erythema.  Eyes:     Conjunctiva/sclera: Conjunctivae normal.  Pupils: Pupils are equal, round, and reactive to light.  Cardiovascular:     Rate and Rhythm: Normal rate.  Pulmonary:     Effort: Pulmonary effort is normal. No respiratory distress.  Abdominal:     General: There is no distension.     Palpations: Abdomen is soft.  Musculoskeletal:        General: Normal range of motion.     Cervical back: Normal range of motion.  Lymphadenopathy:     Cervical: No cervical adenopathy.  Skin:    General: Skin is warm and dry.  Neurological:     Mental Status: She is alert.     UC Treatments / Results  Labs (all labs ordered are listed, but only abnormal results are displayed) Labs Reviewed  RPR  HIV ANTIBODY (ROUTINE TESTING W REFLEX)  CERVICOVAGINAL ANCILLARY ONLY    EKG   Radiology No results found.  Procedures Procedures (including critical care time)  Medications Ordered in UC Medications - No data to display  Initial Impression / Assessment and Plan / UC Course  I have reviewed the triage vital signs and the nursing notes.  Pertinent labs & imaging results that were available during my care of the  patient were reviewed by me and considered in my medical decision making (see chart for details).     Final Clinical Impressions(s) / UC Diagnoses   Final diagnoses:  Encounter for assessment of STD exposure     Discharge Instructions      Check MyChart for your test results Safe sex recommended     ED Prescriptions   None    PDMP not reviewed this encounter.   Eustace Moore, MD 08/01/21 1332

## 2021-08-01 NOTE — ED Triage Notes (Signed)
Pt states that she wanted to be checked for std.  Pt denies having any symptoms. Pt denies any exposure.

## 2021-08-03 LAB — RPR: RPR Ser Ql: NONREACTIVE

## 2021-08-03 LAB — HIV ANTIBODY (ROUTINE TESTING W REFLEX): HIV 1&2 Ab, 4th Generation: NONREACTIVE

## 2021-08-04 LAB — CERVICOVAGINAL ANCILLARY ONLY
Chlamydia: NEGATIVE
Comment: NEGATIVE
Comment: NEGATIVE
Comment: NORMAL
Neisseria Gonorrhea: NEGATIVE
Trichomonas: NEGATIVE

## 2021-08-05 ENCOUNTER — Ambulatory Visit: Payer: Federal, State, Local not specified - PPO | Admitting: Addiction (Substance Use Disorder)

## 2021-08-06 ENCOUNTER — Ambulatory Visit (INDEPENDENT_AMBULATORY_CARE_PROVIDER_SITE_OTHER): Payer: Federal, State, Local not specified - PPO | Admitting: Adult Health

## 2021-08-06 ENCOUNTER — Other Ambulatory Visit: Payer: Self-pay

## 2021-08-06 DIAGNOSIS — F410 Panic disorder [episodic paroxysmal anxiety] without agoraphobia: Secondary | ICD-10-CM

## 2021-08-06 DIAGNOSIS — F331 Major depressive disorder, recurrent, moderate: Secondary | ICD-10-CM | POA: Diagnosis not present

## 2021-08-06 DIAGNOSIS — G4700 Insomnia, unspecified: Secondary | ICD-10-CM | POA: Diagnosis not present

## 2021-08-06 DIAGNOSIS — F411 Generalized anxiety disorder: Secondary | ICD-10-CM | POA: Diagnosis not present

## 2021-08-06 NOTE — Progress Notes (Signed)
Briselda Lippincott ID:[id number] March 02, 2001 21 y.o.  Subjective:   Patient ID:  Angi Ramel is a 21 y.o. (DOB 06-04-2001) female.  Chief Complaint: No chief complaint on file.   HPI Manasvini Delker presents to the office today for follow-up of GAD, MDD, insomnia, and panic attacks.   Describes mood today as "ok". Pleasant. Tearful at times. Mood symptoms - reports depression - "not as bad", anxiety - "sometimes", and irritability "at times". Reports mood swings - "more lows lately". Feels more overwhelmed. Struggling with focus and concentration - gets distracted in school and home. Concerned she may be ADHD. 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 Is planning to move back home with family. Stable interest and motivation. Taking medications as prescribed.  Energy levels "horrible". Active, does not have a regular exercise routine. Enjoys some usual interests and activities. Single. Lives alone. Family lives in [place]. Appetite adequate. Weight gain - birth control. Sleeping difficulties. Averages 6 to 7 hours. Focus and concentration difficulties. Completing tasks. Managing aspects of household. Works at the post office. Denies SI or HI.  Denies AH or VH.  Previous medication trials: Xanax   Flowsheet Row ED from 08/01/2021 in [facility] ED from 02/19/2021 in [facility] at [place] No Risk No Risk        Review of Systems:  Review of Systems  Musculoskeletal:  Negative for gait problem.  Neurological:  Negative for tremors.  Psychiatric/Behavioral:         Please refer to HPI   Medications: I have reviewed the patient's current medications.  Current Outpatient Medications  Medication Sig Dispense Refill   etonogestrel (NEXPLANON) 68 MG IMPL implant 1 each by Subdermal route once.     LORazepam (ATIVAN) 0.5 MG tablet Take 1 tablet (0.5 mg total)  by mouth at bedtime. 30 tablet 2   No current facility-administered medications for this visit.    Medication Side Effects: None  Allergies: No Known Allergies  No past medical history on file.  Past Medical History, Surgical history, Social history, and Family history were reviewed and updated as appropriate.   Please see review of systems for further details on the patient's review from today.   Objective:   Physical Exam:  There were no vitals taken for this visit.  Physical Exam Constitutional:      General: She is not in acute distress. Musculoskeletal:        General: No deformity.  Neurological:     Mental Status: She is alert and oriented to person, place, and time.     Coordination: Coordination normal.  Psychiatric:        Attention and Perception: Attention and perception normal. She does not perceive auditory or visual hallucinations.        Mood and Affect: Mood normal. Mood is not anxious or depressed. Affect is not labile, blunt, angry or inappropriate.        Speech: Speech normal.        Behavior: Behavior normal.        Thought Content: Thought content normal. Thought content is not paranoid or delusional. Thought content does not include homicidal or suicidal ideation. Thought content does not include homicidal or suicidal plan.        Cognition and Memory: Cognition and memory normal.        Judgment: Judgment normal.  Comments: Insight intact    Lab Review:     Component Value Date/Time   NA 137 11/24/2019 2115   K 3.8 11/24/2019 2115   CL 102 11/24/2019 2115   CO2 25 11/24/2019 2115   GLUCOSE 119 (H) 11/24/2019 2115   BUN 15 11/24/2019 2115   CREATININE 0.57 11/24/2019 2115   CALCIUM 9.1 11/24/2019 2115   PROT 8.2 (H) 11/24/2019 2115   ALBUMIN 4.4 11/24/2019 2115   AST 17 11/24/2019 2115   ALT 13 11/24/2019 2115   ALKPHOS 51 11/24/2019 2115   BILITOT 0.6 11/24/2019 2115   GFRNONAA >60 11/24/2019 2115   GFRAA >60 11/24/2019 2115        Component Value Date/Time   WBC 6.1 11/24/2019 2115   RBC 5.05 11/24/2019 2115   HGB 12.4 11/24/2019 2115   HCT 40.8 11/24/2019 2115   PLT 252 11/24/2019 2115   MCV 80.8 11/24/2019 2115   MCH 24.6 (L) 11/24/2019 2115   MCHC 30.4 11/24/2019 2115   RDW 14.2 11/24/2019 2115    No results found for: POCLITH, LITHIUM   No results found for: PHENYTOIN, PHENOBARB, VALPROATE, CBMZ   .res Assessment: Plan:     Plan:  PDMP reviewed  1. D/C Prozac 20mg  daily 2. Lorazepam 0.5mg  at hs  Will have patient return to the office to perform ADHD screening tools.  Set up with therapist    RTC 4 weeks  Patient advised to contact office with any questions, adverse effects, or acute worsening in signs and symptoms.  Diagnoses and all orders for this visit:  Generalized anxiety disorder  Insomnia, unspecified type  Panic attacks  Major depressive disorder, recurrent episode, moderate ([place])     Please see After Visit Summary for patient specific instructions.  Future Appointments  Date Time Provider [place]  08/13/2021  2:00 PM Chynah Orihuela, Berdie Ogren, NP CP-CP None    No orders of the defined types were placed in this encounter.   -------------------------------

## 2021-08-07 ENCOUNTER — Encounter: Payer: Self-pay | Admitting: Adult Health

## 2021-08-07 IMAGING — CR DG CHEST 2V
2 series · 2 of 2 positions shown · non-contrast
Comparison: 05/16/2019

CLINICAL DATA: Shortness of breath, chest tightness

EXAM:
CHEST - 2 VIEW

[w chest pa]
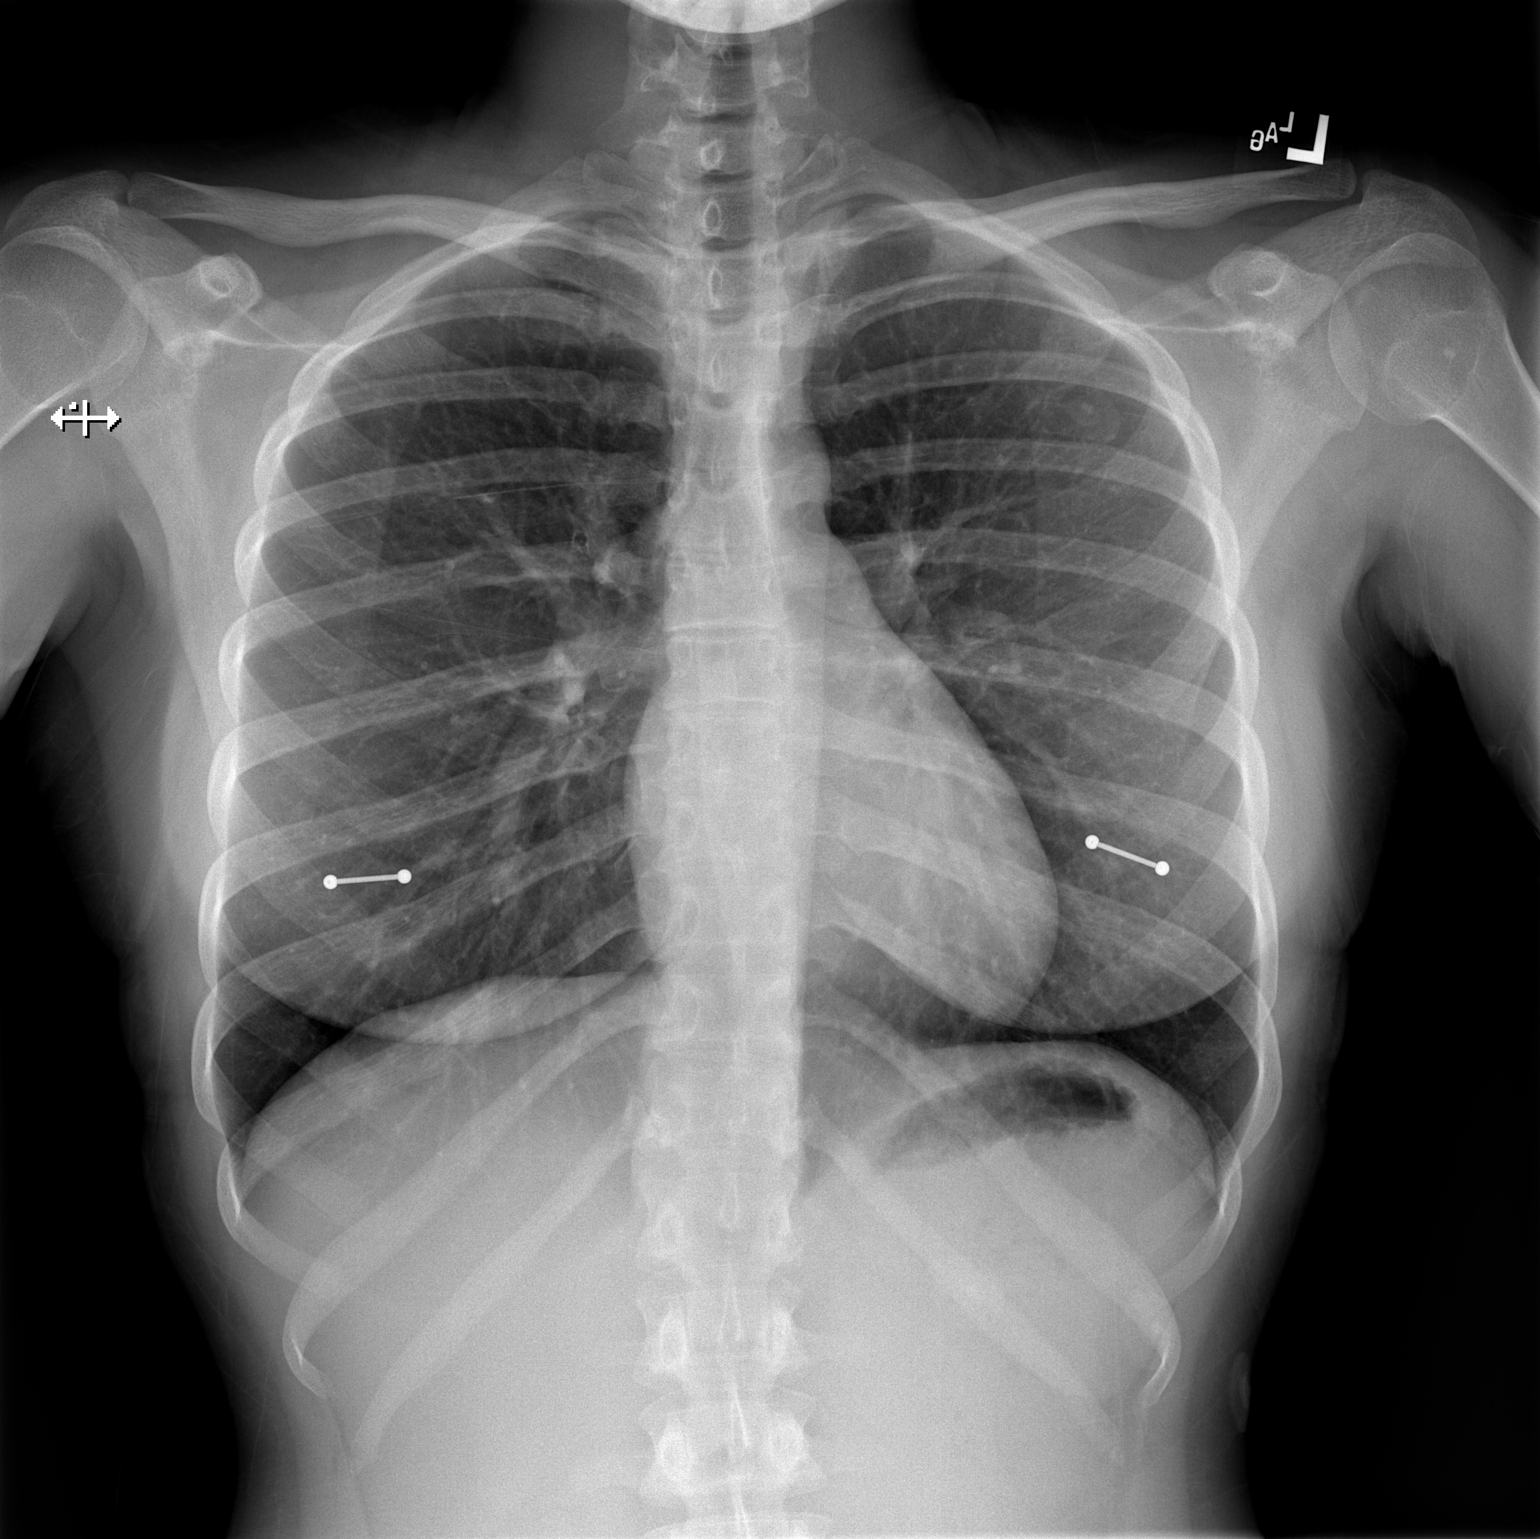

[w chest lat]
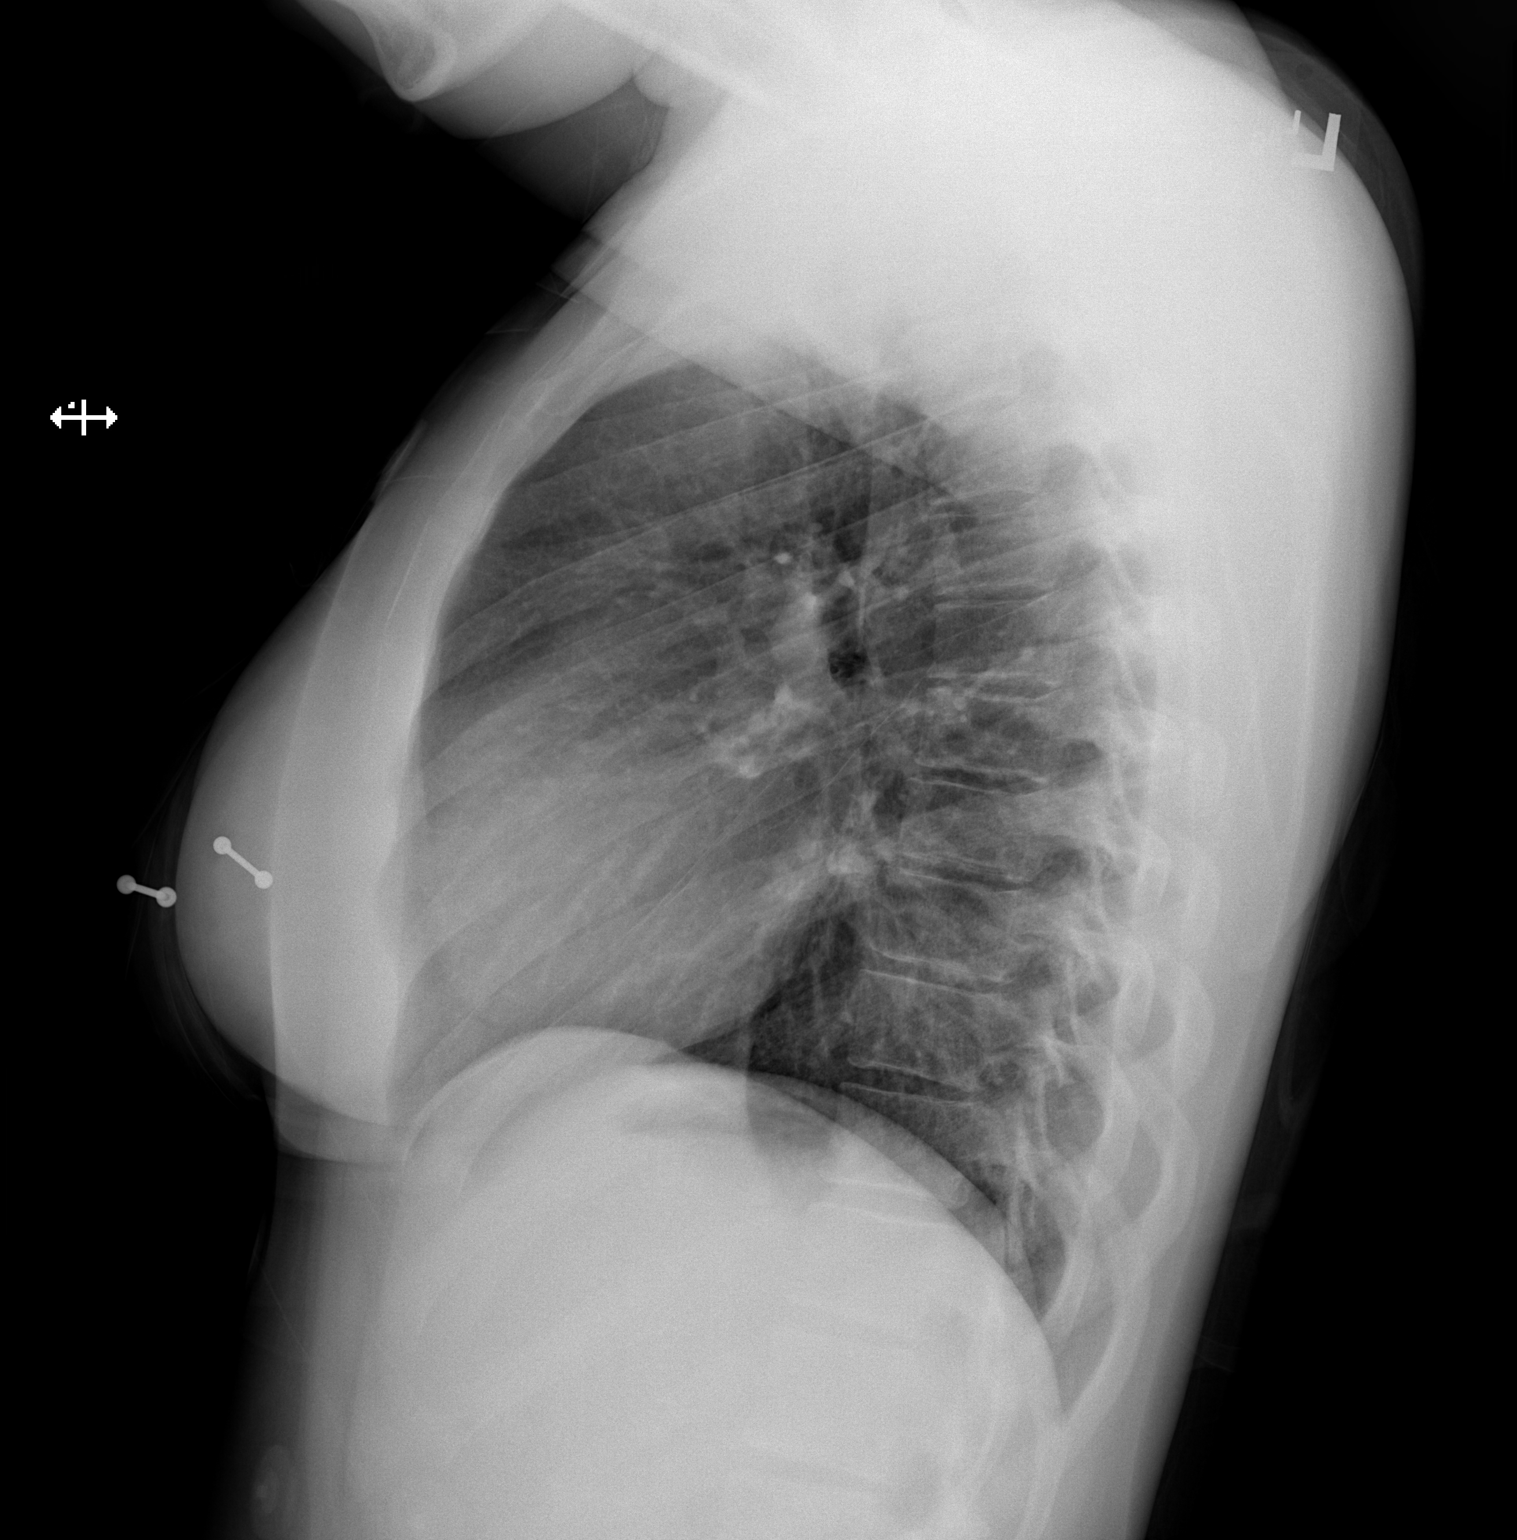

[2 of 2 positions shown; findings below may reference images not displayed]

FINDINGS: Lungs are clear.  No pleural effusion or pneumothorax.

The heart is normal in size.

Visualized osseous structures are within normal limits.
IMPRESSION: Normal chest radiographs.

## 2021-08-12 ENCOUNTER — Ambulatory Visit: Payer: Federal, State, Local not specified - PPO | Admitting: Addiction (Substance Use Disorder)

## 2021-08-13 ENCOUNTER — Ambulatory Visit: Payer: Federal, State, Local not specified - PPO | Admitting: Adult Health

## 2021-08-17 ENCOUNTER — Other Ambulatory Visit: Payer: Self-pay

## 2021-08-17 ENCOUNTER — Encounter: Payer: Self-pay | Admitting: Adult Health

## 2021-08-17 ENCOUNTER — Ambulatory Visit (INDEPENDENT_AMBULATORY_CARE_PROVIDER_SITE_OTHER): Payer: Federal, State, Local not specified - PPO | Admitting: Adult Health

## 2021-08-17 DIAGNOSIS — F411 Generalized anxiety disorder: Secondary | ICD-10-CM

## 2021-08-17 DIAGNOSIS — F908 Attention-deficit hyperactivity disorder, other type: Secondary | ICD-10-CM

## 2021-08-17 DIAGNOSIS — N946 Dysmenorrhea, unspecified: Secondary | ICD-10-CM | POA: Insufficient documentation

## 2021-08-17 DIAGNOSIS — F410 Panic disorder [episodic paroxysmal anxiety] without agoraphobia: Secondary | ICD-10-CM | POA: Diagnosis not present

## 2021-08-17 DIAGNOSIS — F331 Major depressive disorder, recurrent, moderate: Secondary | ICD-10-CM

## 2021-08-17 DIAGNOSIS — G4700 Insomnia, unspecified: Secondary | ICD-10-CM | POA: Diagnosis not present

## 2021-08-17 DIAGNOSIS — N926 Irregular menstruation, unspecified: Secondary | ICD-10-CM | POA: Insufficient documentation

## 2021-08-17 MED ORDER — LORAZEPAM 0.5 MG PO TABS: 0.5000 mg | ORAL_TABLET | Freq: Every day | ORAL | 2 refills | Status: DC

## 2021-08-17 MED ORDER — AMPHETAMINE-DEXTROAMPHETAMINE 20 MG PO TABS: 20.0000 mg | ORAL_TABLET | Freq: Every day | ORAL | 0 refills | Status: DC

## 2021-08-17 NOTE — Addendum Note (Signed)
Addended by: Aloha Gell on: 08/17/2021 02:23 PM   Modules accepted: Level of Service

## 2021-08-17 NOTE — Progress Notes (Addendum)
[name] ID:[id number] [birth year] 21 y.o.  Subjective:   Patient ID:  Kim Walker is a 21 y.o. (DOB [birth year]) female.  Chief Complaint: No chief complaint on file.   HPI Kim Walker presents to the office today for follow-up of GAD, MDD, insomnia, and panic attacks.   Describes mood today as "ok". Pleasant. Tearful at times. Mood symptoms - reports depression - "some days", anxiety - "more so overall", and irritability "in the morning and when around other people". Reports decreased mood swings - "not really". Lacks the attention she needs - stating "I listen, but can't retain". Taking online classes at her pace and is able to make A's. Stating "learning on my own helps me". Doesn't feel rushed or overwhelmed. Reports concerns about the LAST - which is in a classroom. Does not feel she can continue to take classes and be successful without support. Reports her sister has ADHD as well as other family members - would like to be screened and potentially started on medications he works full time at the post office, attending [employer] full-time, and taking an LAST review class 2 nights a week. Is planning to move back home with family in May after graduation. Stable interest and motivation. Taking medications as prescribed.  Energy levels vary. Active, does not have a regular exercise routine. Enjoys some usual interests and activities. Single. Lives alone. Family lives in [place]. Appetite adequate. Weight gain - birth control. Sleeping difficulties. Averages 6 to 7 hours. Focus and concentration difficulties. Gets distracted in school and home. Completing tasks. Managing aspects of household. Works at the post office. Denies SI or HI.  Denies AH or VH.  Previous medication trials: Xanax   Flowsheet Row ED from 08/01/2021 in [facility] ED from 02/19/2021 in [facility] at [place] No Risk No Risk        Review of Systems:   Review of Systems  Musculoskeletal:  Negative for gait problem.  Neurological:  Negative for tremors.  Psychiatric/Behavioral:         Please refer to HPI   Medications: I have reviewed the patient's current medications.  Current Outpatient Medications  Medication Sig Dispense Refill   amphetamine-dextroamphetamine (ADDERALL) 20 MG tablet Take 1 tablet (20 mg total) by mouth daily. 30 tablet 0   etonogestrel (NEXPLANON) 68 MG IMPL implant 1 each by Subdermal route once.     LORazepam (ATIVAN) 0.5 MG tablet Take 1 tablet (0.5 mg total) by mouth at bedtime. 30 tablet 2   No current facility-administered medications for this visit.    Medication Side Effects: None  Allergies: No Known Allergies  No past medical history on file.  Past Medical History, Surgical history, Social history, and Family history were reviewed and updated as appropriate.   Please see review of systems for further details on the patient's review from today.   Objective:   Physical Exam:  There were no vitals taken for this visit.  Physical Exam Constitutional:      General: She is not in acute distress. Musculoskeletal:        General: No deformity.  Neurological:     Mental Status: She is alert and oriented to person, place, and time.     Coordination: Coordination normal.  Psychiatric:        Attention and Perception: Attention and perception normal. She does not perceive auditory or visual hallucinations.        Mood and Affect: Mood  normal. Mood is not anxious or depressed. Affect is not labile, blunt, angry or inappropriate.        Speech: Speech normal.        Behavior: Behavior normal.        Thought Content: Thought content normal. Thought content is not paranoid or delusional. Thought content does not include homicidal or suicidal ideation. Thought content does not include homicidal or suicidal plan.        Cognition and Memory: Cognition and memory normal.        Judgment: Judgment normal.      Comments: Insight intact    Lab Review:     Component Value Date/Time   NA 137 11/24/2019 2115   K 3.8 11/24/2019 2115   CL 102 11/24/2019 2115   CO2 25 11/24/2019 2115   GLUCOSE 119 (H) 11/24/2019 2115   BUN 15 11/24/2019 2115   CREATININE 0.57 11/24/2019 2115   CALCIUM 9.1 11/24/2019 2115   PROT 8.2 (H) 11/24/2019 2115   ALBUMIN 4.4 11/24/2019 2115   AST 17 11/24/2019 2115   ALT 13 11/24/2019 2115   ALKPHOS 51 11/24/2019 2115   BILITOT 0.6 11/24/2019 2115   GFRNONAA >60 11/24/2019 2115   GFRAA >60 11/24/2019 2115       Component Value Date/Time   WBC 6.1 11/24/2019 2115   RBC 5.05 11/24/2019 2115   HGB 12.4 11/24/2019 2115   HCT 40.8 11/24/2019 2115   PLT 252 11/24/2019 2115   MCV 80.8 11/24/2019 2115   MCH 24.6 (L) 11/24/2019 2115   MCHC 30.4 11/24/2019 2115   RDW 14.2 11/24/2019 2115    No results found for: POCLITH, LITHIUM   No results found for: PHENYTOIN, PHENOBARB, VALPROATE, CBMZ   .res Assessment: Plan:    Plan:  PDMP reviewed  1. D/C Prozac 20mg  daily 2. Lorazepam 0.5mg  at hs 3. Adderall 20mg  - take 1/2 tab twice daily  109/66/87  [facility] ADHD testing completed in office - 51/58 ADHD likely [facility] adult ADHD screening tool completed in office. Patient meeting criteria for Adult ADHD diagnosis Other family members diagnosed and treated for ADHD with Adderall.  Set up with therapist    RTC 4 weeks  Patient advised to contact office with any questions, adverse effects, or acute worsening in signs and symptoms.   Time spent with patient was 40 minutes. Greater than 50% of face to face time with patient was spent on counseling and coordination of care.    Diagnoses and all orders for this visit:  Attention deficit hyperactivity disorder (ADHD), other type -     amphetamine-dextroamphetamine (ADDERALL) 20 MG tablet; Take 1 tablet (20 mg total) by mouth daily.  Generalized anxiety disorder -     LORazepam (ATIVAN) 0.5 MG  tablet; Take 1 tablet (0.5 mg total) by mouth at bedtime.  Insomnia, unspecified type  Panic attacks  Major depressive disorder, recurrent episode, moderate ([place])     Please see After Visit Summary for patient specific instructions.  No future appointments.  No orders of the defined types were placed in this encounter.   -------------------------------

## 2021-08-19 ENCOUNTER — Ambulatory Visit: Payer: Federal, State, Local not specified - PPO | Admitting: Addiction (Substance Use Disorder)

## 2021-09-17 ENCOUNTER — Ambulatory Visit (INDEPENDENT_AMBULATORY_CARE_PROVIDER_SITE_OTHER): Payer: Federal, State, Local not specified - PPO | Admitting: Adult Health

## 2021-09-17 ENCOUNTER — Encounter: Payer: Self-pay | Admitting: Adult Health

## 2021-09-17 ENCOUNTER — Other Ambulatory Visit: Payer: Self-pay

## 2021-09-17 DIAGNOSIS — G4700 Insomnia, unspecified: Secondary | ICD-10-CM

## 2021-09-17 DIAGNOSIS — F321 Major depressive disorder, single episode, moderate: Secondary | ICD-10-CM

## 2021-09-17 DIAGNOSIS — F908 Attention-deficit hyperactivity disorder, other type: Secondary | ICD-10-CM

## 2021-09-17 DIAGNOSIS — F411 Generalized anxiety disorder: Secondary | ICD-10-CM

## 2021-09-17 DIAGNOSIS — F410 Panic disorder [episodic paroxysmal anxiety] without agoraphobia: Secondary | ICD-10-CM

## 2021-09-17 NOTE — Progress Notes (Signed)
Kim Walker ?ID:[id number] ?[birth year] ?21 y.o. ? ?Subjective:  ? ?Patient ID:  Kim Walker is a 21 y.o. (DOB Apr 14, 2001) female. ? ?Chief Complaint: No chief complaint on file. ? ? ?HPI ?Kim Walker presents to the office today for follow-up of GAD, MDD, insomnia, and panic attacks.  ? ?Describes mood today as "ok". Pleasant. Tearful at times. Mood symptoms - reports anxiety - "some days",   irritability "situational" and depression - "being away from family". Reports decreased mood swings. Feels like the addition of Adderall has been helpful - getting school work completed and has restarted the LAST class. Stating "I'm more alert and less distracted". Able to put her phone down and focus on tasks. Planning to move back home after upcoming graduation. Recently visited family and is looking forward to going back home. Stable interest and motivation. Taking medications as prescribed.  ?Energy levels vary. Active, does not have a regular exercise routine. ?Enjoys some usual interests and activities. Single. Lives alone. Family lives in [place]. ?Appetite adequate. Weight gain - birth control. ?Sleeping difficulties. Averages 6 to 7 hours. ?Focus and concentration difficulties. Gets distracted in school and home. Completing tasks. Managing aspects of household. Works at the post office. ?Denies SI or HI.  ?Denies AH or VH. ? ?Previous medication trials: Xanax ? ? ?[place] ED from 08/01/2021 in [facility] at [facility] ED from 02/19/2021 in [facility] at [place]  ?C-SSRS RISK CATEGORY No Risk No Risk  ? ?  ?  ? ?Review of Systems:  ?Review of Systems  ?Musculoskeletal:  Negative for gait problem.  ?Neurological:  Negative for tremors.  ?Psychiatric/Behavioral:    ?     Please refer to HPI  ? ?Medications: I have reviewed the patient's current medications. ? ?Current Outpatient Medications  ?Medication Sig Dispense Refill  ? amphetamine-dextroamphetamine (ADDERALL) 20 MG tablet Take 1  tablet (20 mg total) by mouth daily. 30 tablet 0  ? etonogestrel (NEXPLANON) 68 MG IMPL implant 1 each by Subdermal route once.    ? LORazepam (ATIVAN) 0.5 MG tablet Take 1 tablet (0.5 mg total) by mouth at bedtime. 30 tablet 2  ? ?No current facility-administered medications for this visit.  ? ? ?Medication Side Effects: None ? ?Allergies: No Known Allergies ? ?No past medical history on file. ? ?Past Medical History, Surgical history, Social history, and Family history were reviewed and updated as appropriate.  ? ?Please see review of systems for further details on the patient's review from today.  ? ?Objective:  ? ?Physical Exam:  ?There were no vitals taken for this visit. ? ?Physical Exam ?Constitutional:   ?   General: She is not in acute distress. ?Musculoskeletal:     ?   General: No deformity.  ?Neurological:  ?   Mental Status: She is alert and oriented to person, place, and time.  ?   Coordination: Coordination normal.  ?Psychiatric:     ?   Attention and Perception: Attention and perception normal. She does not perceive auditory or visual hallucinations.     ?   Mood and Affect: Mood normal. Mood is not anxious or depressed. Affect is not labile, blunt, angry or inappropriate.     ?   Speech: Speech normal.     ?   Behavior: Behavior normal.     ?   Thought Content: Thought content normal. Thought content is not paranoid or delusional. Thought content does not include homicidal or suicidal ideation. Thought content does not  include homicidal or suicidal plan.     ?   Cognition and Memory: Cognition and memory normal.     ?   Judgment: Judgment normal.  ?   Comments: Insight intact  ? ? ?Lab Review:  ?   ?Component Value Date/Time  ? NA 137 11/24/2019 2115  ? K 3.8 11/24/2019 2115  ? CL 102 11/24/2019 2115  ? CO2 25 11/24/2019 2115  ? GLUCOSE 119 (H) 11/24/2019 2115  ? BUN 15 11/24/2019 2115  ? CREATININE 0.57 11/24/2019 2115  ? CALCIUM 9.1 11/24/2019 2115  ? PROT 8.2 (H) 11/24/2019 2115  ? ALBUMIN 4.4  11/24/2019 2115  ? AST 17 11/24/2019 2115  ? ALT 13 11/24/2019 2115  ? ALKPHOS 51 11/24/2019 2115  ? BILITOT 0.6 11/24/2019 2115  ? GFRNONAA >60 11/24/2019 2115  ? GFRAA >60 11/24/2019 2115  ? ? ?   ?Component Value Date/Time  ? WBC 6.1 11/24/2019 2115  ? RBC 5.05 11/24/2019 2115  ? HGB 12.4 11/24/2019 2115  ? HCT 40.8 11/24/2019 2115  ? PLT 252 11/24/2019 2115  ? MCV 80.8 11/24/2019 2115  ? MCH 24.6 (L) 11/24/2019 2115  ? MCHC 30.4 11/24/2019 2115  ? RDW 14.2 11/24/2019 2115  ? ? ?No results found for: POCLITH, LITHIUM  ? ?No results found for: PHENYTOIN, PHENOBARB, VALPROATE, CBMZ  ? ?.res ?Assessment: Plan:   ? ?Plan: ? ?PDMP reviewed ? ?Lorazepam 0.5mg  at hs ?Adderall 20mg  - take 1/2 tab twice daily ? ?Monitor BP between visit. ? ?[facility] ADHD testing completed in office - 51/58 ADHD likely ?[facility] adult ADHD screening tool completed in office. ?Patient meeting criteria for Adult ADHD diagnosis ?Other family members diagnosed and treated for ADHD with Adderall. ? ?RTC 4 weeks ? ?Patient advised to contact office with any questions, adverse effects, or acute worsening in signs and symptoms. ? ?Discussed potential benefits, risk, and side effects of benzodiazepines to include potential risk of tolerance and dependence, as well as possible drowsiness.  Advised patient not to drive if experiencing drowsiness and to take lowest possible effective dose to minimize risk of dependence and tolerance.  ? ?Discussed potential benefits, risks, and side effects of stimulants with patient to include increased heart rate, palpitations, insomnia, increased anxiety, increased irritability, or decreased appetite.  Instructed patient to contact office if experiencing any significant tolerability issues.  ? ?Time spent with patient was 25 minutes. Greater than 50% of face to face time with patient was spent on counseling and coordination of care.   ? ?Diagnoses and all orders for this visit: ? ?Moderate major depression,  single episode ([place]) ? ?Generalized anxiety disorder ? ?Insomnia, unspecified type ? ?Attention deficit hyperactivity disorder (ADHD), other type ? ?Panic attacks ? ?  ? ?Please see After Visit Summary for patient specific instructions. ? ?No future appointments. ? ?No orders of the defined types were placed in this encounter. ? ? ?------------------------------- ?

## 2021-09-23 ENCOUNTER — Other Ambulatory Visit (HOSPITAL_COMMUNITY)
Admission: RE | Admit: 2021-09-23 | Discharge: 2021-09-23 | Disposition: A | Discharge status: Home / Self Care | Payer: Federal, State, Local not specified - PPO | Source: Ambulatory Visit | Attending: Nurse Practitioner | Admitting: Nurse Practitioner

## 2021-09-23 ENCOUNTER — Other Ambulatory Visit: Payer: Self-pay | Admitting: Nurse Practitioner

## 2021-09-23 DIAGNOSIS — Z124 Encounter for screening for malignant neoplasm of cervix: Secondary | ICD-10-CM | POA: Diagnosis present

## 2021-10-06 ENCOUNTER — Telehealth: Payer: Self-pay | Admitting: Adult Health

## 2021-10-06 ENCOUNTER — Other Ambulatory Visit: Payer: Self-pay | Admitting: Adult Health

## 2021-10-06 DIAGNOSIS — F908 Attention-deficit hyperactivity disorder, other type: Secondary | ICD-10-CM

## 2021-10-06 LAB — CYTOLOGY - PAP
Comment: NEGATIVE
Comment: NEGATIVE
Comment: NEGATIVE
Diagnosis: UNDETERMINED — AB
HPV 16: NEGATIVE
HPV 18 / 45: NEGATIVE
High risk HPV: POSITIVE — AB

## 2021-10-06 MED ORDER — AMPHETAMINE-DEXTROAMPHETAMINE 20 MG PO TABS: 20.0000 mg | ORAL_TABLET | Freq: Every day | ORAL | 0 refills | Status: DC

## 2021-10-06 NOTE — Telephone Encounter (Signed)
Pt would like refill of Adderall sent to [facility] in [place].  She says it is in stock. ? ?No upcoming appt scheduled. ?

## 2021-10-06 NOTE — Telephone Encounter (Signed)
Script sent  

## 2021-11-25 ENCOUNTER — Telehealth: Payer: Self-pay | Admitting: Adult Health

## 2021-11-25 NOTE — Telephone Encounter (Signed)
Pt LVM 5/30 @ 5:09p.  She said she has moved from [place] back home to [place].  She is asking how she can continue to get her Lorazepam and Adderall.  Does she need to find a doc locally for the Adderall?  Can she have the [facility] from [facility] [place]?  Pls let he know what she should do.  No upcoming appts scheduled.

## 2021-11-25 NOTE — Telephone Encounter (Signed)
LVM to rtc 

## 2021-11-25 NOTE — Telephone Encounter (Signed)
Please review when you return.I did inform her she would most likely have to find another provider but I was not sure about the time frame

## 2021-11-26 NOTE — Telephone Encounter (Signed)
LVM to rtc and let office staff know which pharmacy to send meds too

## 2021-11-26 NOTE — Telephone Encounter (Signed)
[facility]

## 2021-11-27 ENCOUNTER — Other Ambulatory Visit: Payer: Self-pay

## 2021-11-27 DIAGNOSIS — F411 Generalized anxiety disorder: Secondary | ICD-10-CM

## 2021-11-27 DIAGNOSIS — F908 Attention-deficit hyperactivity disorder, other type: Secondary | ICD-10-CM

## 2021-11-27 MED ORDER — AMPHETAMINE-DEXTROAMPHETAMINE 20 MG PO TABS: 20.0000 mg | ORAL_TABLET | Freq: Every day | ORAL | 0 refills | Status: AC

## 2021-11-27 MED ORDER — LORAZEPAM 0.5 MG PO TABS: 0.5000 mg | ORAL_TABLET | Freq: Every day | ORAL | 0 refills | Status: AC

## 2021-11-27 NOTE — Telephone Encounter (Signed)
Pended.

## 2021-11-29 ENCOUNTER — Telehealth: Payer: Self-pay

## 2021-11-29 NOTE — Telephone Encounter (Signed)
The Prior Authorization request has been approved for Adderall. The authorization is valid from 10/30/2021 to 11/29/2022. Quantity limits or daily dosing limits will apply to the prior approval. This information can be viewed at any time by visiting our website at [email or web address]. Click on the Prior Approval list link, scroll down and select the criteria for your specific medication, Amphetamine-Dextroamphetamine 20MG  OR TABS.    Prior Authorization submitted for Amphetamine-dextroamphetamine 20 mg #30

## 2022-02-10 ENCOUNTER — Other Ambulatory Visit: Payer: Self-pay | Admitting: Physician Assistant

## 2022-02-10 DIAGNOSIS — F411 Generalized anxiety disorder: Secondary | ICD-10-CM
